# Patient Record
Sex: Female | Born: 1935 | Race: Black or African American | Hispanic: No | Smoking: Never smoker
Health system: Southern US, Community
[De-identification: ages and names within clinical notes are randomized; demographics above are authoritative.]

## PROBLEM LIST (undated history)

## (undated) DIAGNOSIS — R51 Headache: Secondary | ICD-10-CM

## (undated) DIAGNOSIS — K5900 Constipation, unspecified: Secondary | ICD-10-CM

## (undated) DIAGNOSIS — M1711 Unilateral primary osteoarthritis, right knee: Secondary | ICD-10-CM

## (undated) DIAGNOSIS — M1990 Unspecified osteoarthritis, unspecified site: Secondary | ICD-10-CM

## (undated) DIAGNOSIS — Z9289 Personal history of other medical treatment: Secondary | ICD-10-CM

## (undated) DIAGNOSIS — K219 Gastro-esophageal reflux disease without esophagitis: Secondary | ICD-10-CM

## (undated) DIAGNOSIS — R0602 Shortness of breath: Secondary | ICD-10-CM

## (undated) DIAGNOSIS — M1712 Unilateral primary osteoarthritis, left knee: Secondary | ICD-10-CM

## (undated) DIAGNOSIS — I10 Essential (primary) hypertension: Secondary | ICD-10-CM

## (undated) HISTORY — PX: COLONOSCOPY: SHX174

## (undated) HISTORY — PX: KNEE ARTHROSCOPY: SHX127

## (undated) HISTORY — PX: ROTATOR CUFF REPAIR: SHX139

---

## 2012-05-08 ENCOUNTER — Other Ambulatory Visit: Payer: Self-pay | Admitting: Gastroenterology

## 2012-05-08 DIAGNOSIS — R110 Nausea: Secondary | ICD-10-CM

## 2012-05-16 ENCOUNTER — Encounter (HOSPITAL_COMMUNITY)
Admission: RE | Admit: 2012-05-16 | Discharge: 2012-05-16 | Disposition: A | Discharge status: Home / Self Care | Payer: Medicare Other | Source: Ambulatory Visit | Attending: Gastroenterology | Admitting: Gastroenterology

## 2012-05-16 DIAGNOSIS — R110 Nausea: Secondary | ICD-10-CM | POA: Insufficient documentation

## 2012-05-16 MED ORDER — TECHNETIUM TC 99M SULFUR COLLOID
2.0000 | Freq: Once | INTRAVENOUS | Status: AC | PRN
  Administered 2012-05-16: 2 via ORAL

## 2014-07-08 ENCOUNTER — Other Ambulatory Visit: Payer: Self-pay | Admitting: Orthopedic Surgery

## 2014-08-02 ENCOUNTER — Other Ambulatory Visit (HOSPITAL_COMMUNITY): Payer: Medicare Other

## 2014-08-02 ENCOUNTER — Encounter (HOSPITAL_COMMUNITY): Payer: Self-pay | Admitting: Pharmacy Technician

## 2014-08-02 ENCOUNTER — Ambulatory Visit (HOSPITAL_COMMUNITY)
Admission: RE | Admit: 2014-08-02 | Discharge: 2014-08-02 | Disposition: A | Discharge status: Home / Self Care | Payer: Medicare HMO | Source: Ambulatory Visit | Attending: Anesthesiology | Admitting: Anesthesiology

## 2014-08-02 ENCOUNTER — Encounter (HOSPITAL_COMMUNITY)
Admission: RE | Admit: 2014-08-02 | Discharge: 2014-08-02 | Disposition: A | Discharge status: Home / Self Care | Payer: Medicare HMO | Source: Ambulatory Visit | Attending: Orthopedic Surgery | Admitting: Orthopedic Surgery

## 2014-08-02 ENCOUNTER — Encounter (HOSPITAL_COMMUNITY): Payer: Self-pay

## 2014-08-02 DIAGNOSIS — K5900 Constipation, unspecified: Secondary | ICD-10-CM | POA: Diagnosis not present

## 2014-08-02 DIAGNOSIS — R51 Headache: Secondary | ICD-10-CM | POA: Diagnosis not present

## 2014-08-02 DIAGNOSIS — R0609 Other forms of dyspnea: Secondary | ICD-10-CM | POA: Diagnosis not present

## 2014-08-02 DIAGNOSIS — K219 Gastro-esophageal reflux disease without esophagitis: Secondary | ICD-10-CM | POA: Diagnosis not present

## 2014-08-02 DIAGNOSIS — I10 Essential (primary) hypertension: Secondary | ICD-10-CM | POA: Diagnosis not present

## 2014-08-02 DIAGNOSIS — Z01818 Encounter for other preprocedural examination: Secondary | ICD-10-CM | POA: Diagnosis present

## 2014-08-02 DIAGNOSIS — Z01810 Encounter for preprocedural cardiovascular examination: Secondary | ICD-10-CM | POA: Insufficient documentation

## 2014-08-02 DIAGNOSIS — R0989 Other specified symptoms and signs involving the circulatory and respiratory systems: Secondary | ICD-10-CM | POA: Diagnosis not present

## 2014-08-02 HISTORY — DX: Shortness of breath: R06.02

## 2014-08-02 HISTORY — DX: Unspecified osteoarthritis, unspecified site: M19.90

## 2014-08-02 HISTORY — DX: Constipation, unspecified: K59.00

## 2014-08-02 HISTORY — DX: Personal history of other medical treatment: Z92.89

## 2014-08-02 HISTORY — DX: Gastro-esophageal reflux disease without esophagitis: K21.9

## 2014-08-02 HISTORY — DX: Headache: R51

## 2014-08-02 HISTORY — DX: Essential (primary) hypertension: I10

## 2014-08-02 LAB — BASIC METABOLIC PANEL
ANION GAP: 12 (ref 5–15)
BUN: 11 mg/dL (ref 6–23)
CALCIUM: 9.2 mg/dL (ref 8.4–10.5)
CO2: 27 mEq/L (ref 19–32)
Chloride: 104 mEq/L (ref 96–112)
Creatinine, Ser: 0.92 mg/dL (ref 0.50–1.10)
GFR calc non Af Amer: 57 mL/min — ABNORMAL LOW (ref >90)
GFR, EST AFRICAN AMERICAN: 66 mL/min — AB (ref >90)
Glucose, Bld: 93 mg/dL (ref 70–99)
Potassium: 3.5 mEq/L — ABNORMAL LOW (ref 3.7–5.3)
Sodium: 143 mEq/L (ref 137–147)

## 2014-08-02 LAB — ABO/RH: ABO/RH(D): A POS

## 2014-08-02 LAB — CBC
HEMATOCRIT: 33.6 % — AB (ref 36.0–46.0)
Hemoglobin: 11.5 g/dL — ABNORMAL LOW (ref 12.0–15.0)
MCH: 32.8 pg (ref 26.0–34.0)
MCHC: 34.2 g/dL (ref 30.0–36.0)
MCV: 95.7 fL (ref 78.0–100.0)
Platelets: 227 10*3/uL (ref 150–400)
RBC: 3.51 MIL/uL — ABNORMAL LOW (ref 3.87–5.11)
RDW: 14.5 % (ref 11.5–15.5)
WBC: 4.1 10*3/uL (ref 4.0–10.5)

## 2014-08-02 LAB — PROTIME-INR
INR: 1.01 (ref 0.00–1.49)
Prothrombin Time: 13.3 seconds (ref 11.6–15.2)

## 2014-08-02 LAB — SURGICAL PCR SCREEN
MRSA, PCR: NEGATIVE
Staphylococcus aureus: NEGATIVE

## 2014-08-02 LAB — APTT: aPTT: 26 seconds (ref 24–37)

## 2014-08-02 NOTE — Pre-Procedure Instructions (Signed)
Kelsey Novak  08/02/2014   Your procedure is scheduled on: Tuesday, September 29.  Report to [facility] Admitting at 8:40AM.  Call this number if you have problems the morning of surgery: [phone]   Remember:   Do not eat food or drink liquids after midnight Monday, September 28.  Take these medicines the morning of surgery with A SIP OF WATER: - Amlodipine, Omeprazole. Take if needed: Tylenol.               Stop taking Ibuprofen or Aleve.   Do not wear jewelry, make-up or nail polish.  Do not wear lotions, powders, or perfumes.   Do not shave 48 hours prior to surgery.   Do not bring valuables to the hospital.             [facility] is not responsible for any belongings or valuables.               Contacts, dentures or bridgework may not be worn into surgery.  Leave suitcase in the car. After surgery it may be brought to your room.  For patients admitted to the hospital, discharge time is determined by your treatment team.                Special Instructions: Review  [facility] - Preparing For Surgery.   Please read over the following fact sheets that you were given: Pain Booklet, Coughing and Deep Breathing, Blood Transfusion Information and Surgical Site Infection Prevention and Incentive Spirometery

## 2014-08-09 MED ORDER — CEFAZOLIN SODIUM-DEXTROSE 2-3 GM-% IV SOLR
2.0000 g | INTRAVENOUS | Status: AC
  Administered 2014-08-10: 2 g via INTRAVENOUS
  Filled 2014-08-09: qty 50

## 2014-08-10 ENCOUNTER — Inpatient Hospital Stay (HOSPITAL_COMMUNITY): Payer: Medicare HMO | Admitting: Certified Registered Nurse Anesthetist

## 2014-08-10 ENCOUNTER — Inpatient Hospital Stay (HOSPITAL_COMMUNITY): Payer: Medicare HMO

## 2014-08-10 ENCOUNTER — Inpatient Hospital Stay (HOSPITAL_COMMUNITY)
Admission: RE | Admit: 2014-08-10 | Discharge: 2014-08-14 | DRG: 470 | Disposition: A | Discharge status: Home Health | Payer: Medicare HMO | Source: Ambulatory Visit | Attending: Orthopedic Surgery | Admitting: Orthopedic Surgery

## 2014-08-10 ENCOUNTER — Encounter (HOSPITAL_COMMUNITY): Payer: Self-pay | Admitting: *Deleted

## 2014-08-10 ENCOUNTER — Encounter (HOSPITAL_COMMUNITY)
Admission: RE | Disposition: A | Discharge status: Home Health | Payer: Self-pay | Source: Ambulatory Visit | Attending: Orthopedic Surgery

## 2014-08-10 ENCOUNTER — Encounter (HOSPITAL_COMMUNITY): Payer: Medicare HMO | Admitting: Certified Registered Nurse Anesthetist

## 2014-08-10 DIAGNOSIS — M1710 Unilateral primary osteoarthritis, unspecified knee: Secondary | ICD-10-CM | POA: Diagnosis present

## 2014-08-10 DIAGNOSIS — Z79899 Other long term (current) drug therapy: Secondary | ICD-10-CM

## 2014-08-10 DIAGNOSIS — D62 Acute posthemorrhagic anemia: Secondary | ICD-10-CM | POA: Diagnosis not present

## 2014-08-10 DIAGNOSIS — I10 Essential (primary) hypertension: Secondary | ICD-10-CM | POA: Diagnosis present

## 2014-08-10 DIAGNOSIS — K219 Gastro-esophageal reflux disease without esophagitis: Secondary | ICD-10-CM | POA: Diagnosis present

## 2014-08-10 DIAGNOSIS — M179 Osteoarthritis of knee, unspecified: Principal | ICD-10-CM | POA: Diagnosis present

## 2014-08-10 DIAGNOSIS — Z7982 Long term (current) use of aspirin: Secondary | ICD-10-CM | POA: Diagnosis not present

## 2014-08-10 DIAGNOSIS — M1712 Unilateral primary osteoarthritis, left knee: Secondary | ICD-10-CM | POA: Diagnosis present

## 2014-08-10 HISTORY — DX: Unilateral primary osteoarthritis, left knee: M17.12

## 2014-08-10 HISTORY — PX: TOTAL KNEE ARTHROPLASTY: SHX125

## 2014-08-10 SURGERY — ARTHROPLASTY, KNEE, TOTAL
Anesthesia: General | Site: Knee | Laterality: Left

## 2014-08-10 MED ORDER — MENTHOL 3 MG MT LOZG: 1.0000 | LOZENGE | OROMUCOSAL | Status: DC | PRN

## 2014-08-10 MED ORDER — DEXAMETHASONE SODIUM PHOSPHATE 10 MG/ML IJ SOLN
INTRAMUSCULAR | Status: DC | PRN
  Administered 2014-08-10: 10 mg via INTRAVENOUS

## 2014-08-10 MED ORDER — KETOROLAC TROMETHAMINE 15 MG/ML IJ SOLN
INTRAMUSCULAR | Status: AC
Start: 2014-08-10 — End: 2014-08-11
  Filled 2014-08-10: qty 1

## 2014-08-10 MED ORDER — ACETAMINOPHEN 325 MG PO TABS
650.0000 mg | ORAL_TABLET | Freq: Four times a day (QID) | ORAL | Status: DC | PRN
Start: 2014-08-10 — End: 2014-08-14

## 2014-08-10 MED ORDER — HYDROCODONE-ACETAMINOPHEN 10-325 MG PO TABS: 1.0000 | ORAL_TABLET | Freq: Four times a day (QID) | ORAL | Status: DC | PRN

## 2014-08-10 MED ORDER — METOCLOPRAMIDE HCL 5 MG/ML IJ SOLN: 5.0000 mg | Freq: Three times a day (TID) | INTRAMUSCULAR | Status: DC | PRN

## 2014-08-10 MED ORDER — HYDROMORPHONE HCL 1 MG/ML IJ SOLN
0.2500 mg | INTRAMUSCULAR | Status: DC | PRN
  Administered 2014-08-10 (×5): 0.25 mg via INTRAVENOUS

## 2014-08-10 MED ORDER — METHOCARBAMOL 1000 MG/10ML IJ SOLN
500.0000 mg | Freq: Four times a day (QID) | INTRAVENOUS | Status: DC | PRN
  Filled 2014-08-10: qty 5

## 2014-08-10 MED ORDER — DOCUSATE SODIUM 100 MG PO CAPS
100.0000 mg | ORAL_CAPSULE | Freq: Two times a day (BID) | ORAL | Status: DC
  Administered 2014-08-11 – 2014-08-14 (×8): 100 mg via ORAL
  Filled 2014-08-10 (×8): qty 1

## 2014-08-10 MED ORDER — WHITE PETROLATUM GEL
Status: AC
  Administered 2014-08-10: 1
  Filled 2014-08-10: qty 5

## 2014-08-10 MED ORDER — MIDAZOLAM HCL 2 MG/2ML IJ SOLN
INTRAMUSCULAR | Status: AC
  Administered 2014-08-10: 0.5 mg
  Filled 2014-08-10: qty 2

## 2014-08-10 MED ORDER — SODIUM CHLORIDE 0.9 % IR SOLN
Status: DC | PRN
  Administered 2014-08-10: 1000 mL

## 2014-08-10 MED ORDER — LINACLOTIDE 145 MCG PO CAPS
145.0000 ug | ORAL_CAPSULE | Freq: Every day | ORAL | Status: DC
  Administered 2014-08-10 – 2014-08-14 (×5): 145 ug via ORAL
  Filled 2014-08-10 (×5): qty 1

## 2014-08-10 MED ORDER — RIVAROXABAN 10 MG PO TABS
10.0000 mg | ORAL_TABLET | Freq: Every day | ORAL | Status: DC
  Administered 2014-08-11 – 2014-08-14 (×4): 10 mg via ORAL
  Filled 2014-08-10 (×5): qty 1

## 2014-08-10 MED ORDER — INFLUENZA VAC SPLIT QUAD 0.5 ML IM SUSY
0.5000 mL | PREFILLED_SYRINGE | INTRAMUSCULAR | Status: AC
  Administered 2014-08-12: 0.5 mL via INTRAMUSCULAR
  Filled 2014-08-10: qty 0.5

## 2014-08-10 MED ORDER — ONDANSETRON HCL 4 MG/2ML IJ SOLN
INTRAMUSCULAR | Status: DC | PRN
  Administered 2014-08-10: 4 mg via INTRAVENOUS

## 2014-08-10 MED ORDER — PHENOL 1.4 % MT LIQD: 1.0000 | OROMUCOSAL | Status: DC | PRN

## 2014-08-10 MED ORDER — METHOCARBAMOL 500 MG PO TABS
500.0000 mg | ORAL_TABLET | Freq: Four times a day (QID) | ORAL | Status: DC | PRN
  Administered 2014-08-11 – 2014-08-14 (×3): 500 mg via ORAL
  Filled 2014-08-10 (×4): qty 1

## 2014-08-10 MED ORDER — ACETAMINOPHEN 650 MG RE SUPP
650.0000 mg | Freq: Four times a day (QID) | RECTAL | Status: DC | PRN
Start: 2014-08-10 — End: 2014-08-14

## 2014-08-10 MED ORDER — MORPHINE SULFATE 2 MG/ML IJ SOLN
1.0000 mg | INTRAMUSCULAR | Status: DC | PRN
  Administered 2014-08-11: 1 mg via INTRAVENOUS
  Filled 2014-08-10: qty 1

## 2014-08-10 MED ORDER — FENTANYL CITRATE 0.05 MG/ML IJ SOLN
INTRAMUSCULAR | Status: AC
  Filled 2014-08-10: qty 5

## 2014-08-10 MED ORDER — POTASSIUM CHLORIDE IN NACL 20-0.45 MEQ/L-% IV SOLN
INTRAVENOUS | Status: DC
  Administered 2014-08-11: 03:00:00 via INTRAVENOUS
  Filled 2014-08-10 (×8): qty 1000

## 2014-08-10 MED ORDER — AMLODIPINE BESYLATE 10 MG PO TABS
10.0000 mg | ORAL_TABLET | Freq: Every day | ORAL | Status: DC
  Administered 2014-08-11 – 2014-08-14 (×4): 10 mg via ORAL
  Filled 2014-08-10 (×4): qty 1

## 2014-08-10 MED ORDER — TRIAMTERENE-HCTZ 37.5-25 MG PO TABS
0.5000 | ORAL_TABLET | Freq: Every day | ORAL | Status: DC
  Administered 2014-08-10 – 2014-08-14 (×5): 0.5 via ORAL
  Filled 2014-08-10 (×5): qty 0.5

## 2014-08-10 MED ORDER — METOCLOPRAMIDE HCL 10 MG PO TABS: 5.0000 mg | ORAL_TABLET | Freq: Three times a day (TID) | ORAL | Status: DC | PRN

## 2014-08-10 MED ORDER — LIDOCAINE HCL (CARDIAC) 20 MG/ML IV SOLN
INTRAVENOUS | Status: DC | PRN
Start: 2014-08-10 — End: 2014-08-10
  Administered 2014-08-10: 50 mg via INTRAVENOUS

## 2014-08-10 MED ORDER — KETOROLAC TROMETHAMINE 15 MG/ML IJ SOLN
7.5000 mg | Freq: Four times a day (QID) | INTRAMUSCULAR | Status: AC
  Administered 2014-08-10 (×2): 7.5 mg via INTRAVENOUS

## 2014-08-10 MED ORDER — PHENYLEPHRINE HCL 10 MG/ML IJ SOLN
10.0000 mg | INTRAVENOUS | Status: DC | PRN
  Administered 2014-08-10: 5 ug via INTRAVENOUS
  Administered 2014-08-10: 10 ug/min via INTRAVENOUS

## 2014-08-10 MED ORDER — METHOCARBAMOL 1000 MG/10ML IJ SOLN
500.0000 mg | INTRAVENOUS | Status: AC
  Administered 2014-08-10: 500 mg via INTRAVENOUS
  Filled 2014-08-10: qty 5

## 2014-08-10 MED ORDER — RIVAROXABAN 10 MG PO TABS
10.0000 mg | ORAL_TABLET | Freq: Every day | ORAL | Status: DC
Start: 2014-08-10 — End: 2018-08-07

## 2014-08-10 MED ORDER — PHENYLEPHRINE HCL 10 MG/ML IJ SOLN
INTRAMUSCULAR | Status: DC | PRN
  Administered 2014-08-10 (×2): 80 ug via INTRAVENOUS

## 2014-08-10 MED ORDER — FENTANYL CITRATE 0.05 MG/ML IJ SOLN
INTRAMUSCULAR | Status: DC | PRN
  Administered 2014-08-10 (×5): 50 ug via INTRAVENOUS

## 2014-08-10 MED ORDER — ONDANSETRON HCL 4 MG PO TABS: 4.0000 mg | ORAL_TABLET | Freq: Three times a day (TID) | ORAL | Status: DC | PRN

## 2014-08-10 MED ORDER — LACTATED RINGERS IV SOLN
INTRAVENOUS | Status: DC | PRN
Start: 2014-08-10 — End: 2014-08-10
  Administered 2014-08-10: 11:00:00 via INTRAVENOUS

## 2014-08-10 MED ORDER — OXYCODONE HCL 5 MG PO TABS: 5.0000 mg | ORAL_TABLET | Freq: Once | ORAL | Status: DC | PRN

## 2014-08-10 MED ORDER — FENTANYL CITRATE 0.05 MG/ML IJ SOLN
25.0000 ug | Freq: Once | INTRAMUSCULAR | Status: AC
  Administered 2014-08-10: 25 ug via INTRAVENOUS

## 2014-08-10 MED ORDER — HYDROCODONE-ACETAMINOPHEN 10-325 MG PO TABS
1.0000 | ORAL_TABLET | ORAL | Status: DC | PRN
  Administered 2014-08-11 (×3): 1 via ORAL
  Administered 2014-08-11: 2 via ORAL
  Administered 2014-08-11: 1 via ORAL
  Administered 2014-08-12 – 2014-08-14 (×7): 2 via ORAL
  Filled 2014-08-10: qty 1
  Filled 2014-08-10 (×3): qty 2
  Filled 2014-08-10: qty 1
  Filled 2014-08-10 (×3): qty 2
  Filled 2014-08-10 (×2): qty 1
  Filled 2014-08-10 (×3): qty 2

## 2014-08-10 MED ORDER — HYDROMORPHONE HCL 1 MG/ML IJ SOLN
INTRAMUSCULAR | Status: AC
  Filled 2014-08-10: qty 1

## 2014-08-10 MED ORDER — MAGNESIUM CITRATE PO SOLN: 1.0000 | Freq: Once | ORAL | Status: AC | PRN

## 2014-08-10 MED ORDER — OXYCODONE HCL 5 MG/5ML PO SOLN: 5.0000 mg | Freq: Once | ORAL | Status: DC | PRN

## 2014-08-10 MED ORDER — FENTANYL CITRATE 0.05 MG/ML IJ SOLN
INTRAMUSCULAR | Status: AC
  Administered 2014-08-10: 25 ug via INTRAVENOUS
  Filled 2014-08-10: qty 2

## 2014-08-10 MED ORDER — PROMETHAZINE HCL 25 MG/ML IJ SOLN: 6.2500 mg | INTRAMUSCULAR | Status: DC | PRN

## 2014-08-10 MED ORDER — ASPIRIN EC 81 MG PO TBEC
81.0000 mg | DELAYED_RELEASE_TABLET | Freq: Every day | ORAL | Status: DC
  Administered 2014-08-11 – 2014-08-14 (×4): 81 mg via ORAL
  Filled 2014-08-10 (×4): qty 1

## 2014-08-10 MED ORDER — LISINOPRIL 20 MG PO TABS
20.0000 mg | ORAL_TABLET | Freq: Every day | ORAL | Status: DC
  Administered 2014-08-10 – 2014-08-14 (×5): 20 mg via ORAL
  Filled 2014-08-10 (×5): qty 1

## 2014-08-10 MED ORDER — ONDANSETRON HCL 4 MG PO TABS: 4.0000 mg | ORAL_TABLET | Freq: Four times a day (QID) | ORAL | Status: DC | PRN

## 2014-08-10 MED ORDER — ROPIVACAINE HCL 5 MG/ML IJ SOLN
INTRAMUSCULAR | Status: DC | PRN
  Administered 2014-08-10: 30 mL

## 2014-08-10 MED ORDER — EPHEDRINE SULFATE 50 MG/ML IJ SOLN: INTRAMUSCULAR | Status: DC | PRN

## 2014-08-10 MED ORDER — PROPOFOL 10 MG/ML IV BOLUS
INTRAVENOUS | Status: AC
  Filled 2014-08-10: qty 20

## 2014-08-10 MED ORDER — ONDANSETRON HCL 4 MG/2ML IJ SOLN
4.0000 mg | Freq: Four times a day (QID) | INTRAMUSCULAR | Status: DC | PRN
  Administered 2014-08-12: 4 mg via INTRAVENOUS
  Filled 2014-08-10: qty 2

## 2014-08-10 MED ORDER — PHENYLEPHRINE HCL 10 MG/ML IJ SOLN: 10.0000 mg | INTRAVENOUS | Status: DC | PRN

## 2014-08-10 MED ORDER — SENNA 8.6 MG PO TABS
1.0000 | ORAL_TABLET | Freq: Two times a day (BID) | ORAL | Status: DC
  Administered 2014-08-10 – 2014-08-14 (×8): 8.6 mg via ORAL
  Filled 2014-08-10 (×9): qty 1

## 2014-08-10 MED ORDER — GLYCOPYRROLATE 0.2 MG/ML IJ SOLN
INTRAMUSCULAR | Status: DC | PRN
  Administered 2014-08-10: 0.2 mg via INTRAVENOUS

## 2014-08-10 MED ORDER — ALUM & MAG HYDROXIDE-SIMETH 200-200-20 MG/5ML PO SUSP: 30.0000 mL | ORAL | Status: DC | PRN

## 2014-08-10 MED ORDER — PROPOFOL 10 MG/ML IV BOLUS
INTRAVENOUS | Status: DC | PRN
  Administered 2014-08-10: 40 mg via INTRAVENOUS
  Administered 2014-08-10: 110 mg via INTRAVENOUS
  Administered 2014-08-10: 50 mg via INTRAVENOUS

## 2014-08-10 MED ORDER — BACLOFEN 10 MG PO TABS
10.0000 mg | ORAL_TABLET | Freq: Three times a day (TID) | ORAL | Status: DC
Start: 2014-08-10 — End: 2018-06-27

## 2014-08-10 MED ORDER — DIPHENHYDRAMINE HCL 12.5 MG/5ML PO ELIX: 12.5000 mg | ORAL_SOLUTION | ORAL | Status: DC | PRN

## 2014-08-10 MED ORDER — CEFAZOLIN SODIUM-DEXTROSE 2-3 GM-% IV SOLR
2.0000 g | Freq: Four times a day (QID) | INTRAVENOUS | Status: AC
  Administered 2014-08-10 – 2014-08-11 (×2): 2 g via INTRAVENOUS
  Filled 2014-08-10 (×2): qty 50

## 2014-08-10 MED ORDER — LACTATED RINGERS IV SOLN
INTRAVENOUS | Status: DC
  Administered 2014-08-10: 10:00:00 via INTRAVENOUS

## 2014-08-10 MED ORDER — KETOROLAC TROMETHAMINE 15 MG/ML IJ SOLN
INTRAMUSCULAR | Status: AC
  Filled 2014-08-10: qty 1

## 2014-08-10 MED ORDER — MIDAZOLAM HCL 2 MG/2ML IJ SOLN: 0.5000 mg | Freq: Once | INTRAMUSCULAR | Status: DC

## 2014-08-10 MED ORDER — BISACODYL 10 MG RE SUPP
10.0000 mg | Freq: Every day | RECTAL | Status: DC | PRN
Start: 2014-08-10 — End: 2014-08-14

## 2014-08-10 MED ORDER — POLYETHYLENE GLYCOL 3350 17 G PO PACK
17.0000 g | PACK | Freq: Every day | ORAL | Status: DC | PRN
  Administered 2014-08-12: 17 g via ORAL

## 2014-08-10 MED ORDER — SENNA-DOCUSATE SODIUM 8.6-50 MG PO TABS: 2.0000 | ORAL_TABLET | Freq: Every day | ORAL | Status: DC

## 2014-08-10 MED ORDER — PANTOPRAZOLE SODIUM 40 MG PO TBEC
80.0000 mg | DELAYED_RELEASE_TABLET | Freq: Every day | ORAL | Status: DC
  Administered 2014-08-11 – 2014-08-14 (×4): 80 mg via ORAL
  Filled 2014-08-10 (×3): qty 2

## 2014-08-10 SURGICAL SUPPLY — 59 items
BANDAGE ELASTIC 6 VELCRO ST LF (GAUZE/BANDAGES/DRESSINGS) ×6 IMPLANT
BANDAGE ESMARK 6X9 LF (GAUZE/BANDAGES/DRESSINGS) ×1 IMPLANT
BENZOIN TINCTURE PRP APPL 2/3 (GAUZE/BANDAGES/DRESSINGS) ×3 IMPLANT
BLADE SAG 18X100X1.27 (BLADE) ×3 IMPLANT
BLADE SAW RECIP 87.9 MT (BLADE) ×3 IMPLANT
BLADE SAW SGTL 13X75X1.27 (BLADE) ×3 IMPLANT
BNDG ESMARK 6X9 LF (GAUZE/BANDAGES/DRESSINGS) ×3
BOOTCOVER CLEANROOM LRG (PROTECTIVE WEAR) ×6 IMPLANT
BOWL SMART MIX CTS (DISPOSABLE) ×3 IMPLANT
CAPT RP KNEE ×3 IMPLANT
CEMENT HV SMART SET (Cement) ×6 IMPLANT
CLOSURE STERI-STRIP 1/2X4 (GAUZE/BANDAGES/DRESSINGS) ×1
CLSR STERI-STRIP ANTIMIC 1/2X4 (GAUZE/BANDAGES/DRESSINGS) ×2 IMPLANT
COVER SURGICAL LIGHT HANDLE (MISCELLANEOUS) ×3 IMPLANT
CUFF TOURNIQUET SINGLE 34IN LL (TOURNIQUET CUFF) ×3 IMPLANT
DRAPE EXTREMITY T 121X128X90 (DRAPE) ×3 IMPLANT
DRAPE PROXIMA HALF (DRAPES) ×6 IMPLANT
DRAPE U-SHAPE 47X51 STRL (DRAPES) ×3 IMPLANT
DRSG AQUACEL AG ADV 3.5X10 (GAUZE/BANDAGES/DRESSINGS) ×3 IMPLANT
DURAPREP 26ML APPLICATOR (WOUND CARE) ×3 IMPLANT
ELECT CAUTERY BLADE 6.4 (BLADE) ×3 IMPLANT
ELECT REM PT RETURN 9FT ADLT (ELECTROSURGICAL) ×3
ELECTRODE REM PT RTRN 9FT ADLT (ELECTROSURGICAL) ×1 IMPLANT
FACESHIELD WRAPAROUND (MASK) ×3 IMPLANT
GAUZE SPONGE 4X4 12PLY STRL (GAUZE/BANDAGES/DRESSINGS) ×3 IMPLANT
GLOVE BIOGEL PI ORTHO PRO SZ8 (GLOVE) ×4
GLOVE ORTHO TXT STRL SZ7.5 (GLOVE) ×3 IMPLANT
GLOVE PI ORTHO PRO STRL SZ8 (GLOVE) ×2 IMPLANT
GLOVE SURG ORTHO 8.0 STRL STRW (GLOVE) ×3 IMPLANT
GOWN STRL REUS W/ TWL XL LVL3 (GOWN DISPOSABLE) ×1 IMPLANT
GOWN STRL REUS W/TWL 2XL LVL3 (GOWN DISPOSABLE) ×3 IMPLANT
GOWN STRL REUS W/TWL XL LVL3 (GOWN DISPOSABLE) ×2
HANDPIECE INTERPULSE COAX TIP (DISPOSABLE) ×2
HOOD PEEL AWAY FACE SHEILD DIS (HOOD) ×6 IMPLANT
IMMOBILIZER KNEE 22 (SOFTGOODS) ×3 IMPLANT
KIT BASIN OR (CUSTOM PROCEDURE TRAY) ×3 IMPLANT
KIT ROOM TURNOVER OR (KITS) ×3 IMPLANT
MANIFOLD NEPTUNE II (INSTRUMENTS) ×3 IMPLANT
NS IRRIG 1000ML POUR BTL (IV SOLUTION) ×3 IMPLANT
PACK TOTAL JOINT (CUSTOM PROCEDURE TRAY) ×3 IMPLANT
PAD ABD 8X10 STRL (GAUZE/BANDAGES/DRESSINGS) ×3 IMPLANT
PAD ARMBOARD 7.5X6 YLW CONV (MISCELLANEOUS) ×6 IMPLANT
PAD CAST 4YDX4 CTTN HI CHSV (CAST SUPPLIES) ×1 IMPLANT
PADDING CAST COTTON 4X4 STRL (CAST SUPPLIES) ×2
PADDING CAST COTTON 6X4 STRL (CAST SUPPLIES) ×3 IMPLANT
SET HNDPC FAN SPRY TIP SCT (DISPOSABLE) ×1 IMPLANT
STAPLER VISISTAT 35W (STAPLE) ×3 IMPLANT
SUCTION FRAZIER TIP 10 FR DISP (SUCTIONS) ×3 IMPLANT
SUT MNCRL AB 4-0 PS2 18 (SUTURE) IMPLANT
SUT VIC AB 0 CT1 27 (SUTURE) ×2
SUT VIC AB 0 CT1 27XBRD ANBCTR (SUTURE) ×1 IMPLANT
SUT VIC AB 2-0 CT1 27 (SUTURE) ×2
SUT VIC AB 2-0 CT1 TAPERPNT 27 (SUTURE) ×1 IMPLANT
SUT VIC AB 3-0 SH 8-18 (SUTURE) ×6 IMPLANT
SYR 30ML LL (SYRINGE) ×3 IMPLANT
TOWEL OR 17X24 6PK STRL BLUE (TOWEL DISPOSABLE) ×3 IMPLANT
TOWEL OR 17X26 10 PK STRL BLUE (TOWEL DISPOSABLE) ×3 IMPLANT
TRAY FOLEY CATH 16FRSI W/METER (SET/KITS/TRAYS/PACK) IMPLANT
WATER STERILE IRR 1000ML POUR (IV SOLUTION) ×6 IMPLANT

## 2014-08-10 NOTE — Op Note (Signed)
DATE OF SURGERY:  08/10/2014 TIME: 1:50 PM  PATIENT NAME:  Kelsey Novak   AGE: 80 y.o.    PRE-OPERATIVE DIAGNOSIS:  djd left knee  POST-OPERATIVE DIAGNOSIS:  Same  PROCEDURE:  Procedure(s): LEFT TOTAL KNEE ARTHROPLASTY   SURGEON:  Eulas Post, MD   ASSISTANT:  Janace Litten, OPA-C, present and scrubbed throughout the case, critical for assistance with exposure, retraction, instrumentation, and closure.  Second Assistant: Alfredo Martinez, PA-Student   OPERATIVE IMPLANTS: Depuy PFC Sigma, Posterior Stabilized.  Femur size 2.5, Tibia size 2.5, Patella size 38 3-peg oval button, with a 10 mm polyethylene insert.   PREOPERATIVE INDICATIONS:  Tam Savoia is a 80 y.o. year old female with end stage bone on bone degenerative arthritis of the knee who failed conservative treatment, including injections, antiinflammatories, activity modification, and assistive devices, and had significant impairment of their activities of daily living, and elected for Total Knee Arthroplasty.   The risks, benefits, and alternatives were discussed at length including but not limited to the risks of infection, bleeding, nerve injury, stiffness, blood clots, the need for revision surgery, cardiopulmonary complications, among others, and they were willing to proceed.   OPERATIVE DESCRIPTION:  The patient was brought to the operative room and placed in a supine position.  General anesthesia was administered.  IV antibiotics were given.  The lower extremity was prepped and draped in the usual sterile fashion.  Time out was performed.  The leg was elevated and exsanguinated and the tourniquet was inflated.  Anterior quadriceps tendon splitting approach was performed.  The patella was everted and osteophytes were removed.  The anterior horn of the medial and lateral meniscus was removed.   The distal femur was opened with the drill and the intramedullary distal femoral cutting jig was utilized, set at 5  degrees resecting 10 mm off the distal femur.  Care was taken to protect the collateral ligaments.  Then the extramedullary tibial cutting jig was utilized making the appropriate cut using the anterior tibial crest as a reference building in appropriate posterior slope.  Care was taken during the cut to protect the medial and collateral ligaments.  The proximal tibia was removed along with the posterior horns of the menisci.  The PCL was sacrificed.  Initially, the cut was not quite deep enough and I had to recut taking an additional 2 mm. The overall bone quality was extremely soft, and during placement of the 10 mm spacer gap, this caused an indentation on the lateral femoral condyle, reflecting fairly significant osteoporosis. Ultimately I closed that area down with cement filling in the small void.  The extensor gap was measured and was approximately 10mm.    The distal femoral sizing jig was applied, taking care to avoid notching.  Then the 4-in-1 cutting jig was applied and the anterior and posterior femur was cut, along with the chamfer cuts.  All posterior osteophytes were removed.  The flexion gap was then measured and was symmetric with the extension gap.  I completed the distal femoral preparation using the appropriate jig to prepare the box.  The patella was then measured, and cut with the saw.  This measured 24 mm before the cut and then 15 mm after the cut.  The proximal tibia sized and prepared accordingly with the reamer and the punch, and then all components were trialed with the 10mm poly insert.  The knee was found to have excellent balance and full motion.    The above named components were then cemented  into place and all excess cement was removed.  The real polyethylene implant was placed.  The knee was easily taken through a range of motion and the patella tracked well and the knee irrigated copiously and the parapatellar and subcutaneous tissue closed with vicryl, and monocryl  with steri strips for the skin.  The wounds were injected with marcaine, and dressed with sterile gauze and the tourniquet released and the patient was awakened and returned to the PACU in stable and satisfactory condition.  There were no complications.  Total tourniquet time was approximately 110 minutes I did release the tourniquet after the cement had cured, confirmed that there was no major arterial injury, and then placed the tourniquet back up for closure. .Marland Kitchen

## 2014-08-10 NOTE — Anesthesia Procedure Notes (Addendum)
Anesthesia Regional Block:  Femoral nerve block  Pre-Anesthetic Checklist: ,, timeout performed, Correct Patient, Correct Site, Correct Laterality, Correct Procedure, Correct Position, site marked, Risks and benefits discussed,  Surgical consent,  Pre-op evaluation,  At surgeon's request and post-op pain management  Laterality: Left  Prep: chloraprep       Needles:  Injection technique: Single-shot  Needle Type: Echogenic Stimulator Needle     Needle Length: 5cm 5 cm Needle Gauge: 22 and 22 G    Additional Needles:  Procedures: ultrasound guided (picture in chart) and nerve stimulator Femoral nerve block  Nerve Stimulator or Paresthesia:  Response: quadraceps contraction, 1.2 mA,   Additional Responses:   Narrative:  Start time: 08/10/2014 9:50 AM End time: 08/10/2014 10:00 AM Injection made incrementally with aspirations every 5 mL.  Performed by: Personally  Anesthesiologist: Marcene Duosobert Destanie Tibbetts, MD  Additional Notes: Functioning IV was confirmed and monitors were applied.  A 50mm 22ga Arrow echogenic stimulator needle was used. Sterile prep and drape,hand hygiene and sterile gloves were used. Ultrasound guidance: relevant anatomy identified, needle position confirmed on ultrasound but unable to stimulate nerve at current less than 1.362mA, local anesthetic spread visualized around nerve(s)., vascular puncture avoided.  Image printed for medical record. Negative aspiration and negative test dose prior to incremental administration of local anesthetic. The patient tolerated the procedure well.

## 2014-08-10 NOTE — H&P (Signed)
PREOPERATIVE H&P  Chief Complaint: djd left knee  HPI: Kelsey Novak is a 80 y.o. female who presents for preoperative history and physical with a diagnosis of djd left knee. Symptoms are rated as moderate to severe, and have been worsening.  This is significantly impairing activities of daily living.  She has elected for surgical management. She has failed injections, activity modification, anti-inflammatories, and assistive devices.   Past Medical History  Diagnosis Date  . GERD (gastroesophageal reflux disease)   . Hypertension   . Headache(784.0)   . Shortness of breath     With exertion  . Constipation   . Arthritis   . History of blood transfusion    Past Surgical History  Procedure Laterality Date  . Knee arthroscopy Left     after a fall  . Rotator cuff repair Right   . Colonoscopy     History   Social History  . Marital Status: Widowed    Spouse Name: N/A    Number of Children: N/A  . Years of Education: N/A   Social History Main Topics  . Smoking status: Never Smoker   . Smokeless tobacco: None  . Alcohol Use: No  . Drug Use: No  . Sexual Activity: None   Other Topics Concern  . None   Social History Narrative  . None   History reviewed. No pertinent family history. No Known Allergies Prior to Admission medications   Medication Sig Start Date End Date Taking? Authorizing Provider  acetaminophen (TYLENOL) 500 MG tablet Take 1,000 mg by mouth daily as needed (for knee pain).    Yes Historical Provider, MD  amLODipine (NORVASC) 10 MG tablet Take 10 mg by mouth daily.   Yes Historical Provider, MD  aspirin EC 81 MG tablet Take 81 mg by mouth daily.   Yes Historical Provider, MD  Cyanocobalamin (VITAMIN B-12) 2500 MCG SUBL Place 2 tablets under the tongue daily.    Yes Historical Provider, MD  Linaclotide Karlene Einstein(LINZESS) 145 MCG CAPS capsule Take 145 mcg by mouth daily.   Yes Historical Provider, MD  lisinopril (PRINIVIL,ZESTRIL) 20 MG tablet Take 20 mg by  mouth daily.   Yes Historical Provider, MD  Multiple Vitamins-Minerals (MULTIVITAL) tablet Take 1 tablet by mouth 2 (two) times daily.    Yes Historical Provider, MD  naproxen sodium (ALEVE) 220 MG tablet Take 440 mg by mouth daily as needed (for breakthrough pain of knee).   Yes Historical Provider, MD  omeprazole (PRILOSEC) 40 MG capsule Take 40 mg by mouth daily.   Yes Historical Provider, MD  ondansetron (ZOFRAN) 4 MG tablet Take 4 mg by mouth every 8 (eight) hours as needed for nausea or vomiting.   Yes Historical Provider, MD  Tetrahydrozoline HCl (EYE DROPS OP) Place 2 drops into both eyes daily.   Yes Historical Provider, MD  triamterene-hydrochlorothiazide (MAXZIDE-25) 37.5-25 MG per tablet Take 0.5 tablets by mouth daily.   Yes Historical Provider, MD     Positive ROS: All other systems have been reviewed and were otherwise negative with the exception of those mentioned in the HPI and as above.  Physical Exam: General: Alert, no acute distress Cardiovascular: No pedal edema Respiratory: No cyanosis, no use of accessory musculature GI: No organomegaly, abdomen is soft and non-tender Skin: No lesions in the area of chief complaint Neurologic: Sensation intact distally Psychiatric: Patient is competent for consent with normal mood and affect Lymphatic: No axillary or cervical lymphadenopathy  MUSCULOSKELETAL: AROM 0-125 deg with crepitance and varus  alignment  XR with end stage osteophyte formation and subchondral sclerosis and loss of joint space (complete).  Assessment: djd left knee  Plan: Plan for Procedure(s): LEFT TOTAL KNEE ARTHROPLASTY  The risks benefits and alternatives were discussed with the patient including but not limited to the risks of nonoperative treatment, versus surgical intervention including infection, bleeding, nerve injury,  blood clots, cardiopulmonary complications, morbidity, mortality, among others, and they were willing to proceed.    Eulas Post, MD Cell [phone]   08/10/2014 10:33 AM

## 2014-08-10 NOTE — Transfer of Care (Signed)
Immediate Anesthesia Transfer of Care Note  Patient: Kelsey MorrowElnora Novak  Procedure(s) Performed: Procedure(s): LEFT TOTAL KNEE ARTHROPLASTY (Left)  Patient Location: PACU  Anesthesia Type:General and GA combined with regional for post-op pain  Level of Consciousness: awake, alert , patient cooperative and responds to stimulation  Airway & Oxygen Therapy: Patient Spontanous Breathing and Patient connected to face mask oxygen  Post-op Assessment: Report given to PACU RN, Post -op Vital signs reviewed and stable and Patient moving all extremities X 4  Post vital signs: Reviewed and stable  Complications: No apparent anesthesia complications

## 2014-08-10 NOTE — Discharge Instructions (Signed)
Diet: As you were doing prior to hospitalization   Shower:  May shower but keep the wounds dry, use an occlusive plastic wrap, NO SOAKING IN TUB.  If the bandage gets wet, change with a clean dry gauze.  Dressing:  Leave dressing in place unless saturated.  If it is saturated, change with dry gauze.  There are sticky tapes (steri-strips) on your wounds and all the stitches are absorbable.  Leave the steri-strips in place when changing your dressings, they will peel off with time, usually 2-3 weeks.  Activity:  Increase activity slowly as tolerated, but follow the weight bearing instructions below.  No lifting or driving for 6 weeks.  Weight Bearing:   As tolerated.    To prevent constipation: you may use a stool softener such as -  Colace (over the counter) 100 mg by mouth twice a day  Drink plenty of fluids (prune juice may be helpful) and high fiber foods Miralax (over the counter) for constipation as needed.    Itching:  If you experience itching with your medications, try taking only a single pain pill, or even half a pain pill at a time.  You may take up to 10 pain pills per day, and you can also use benadryl over the counter for itching or also to help with sleep.   Precautions:  If you experience chest pain or shortness of breath - call 911 immediately for transfer to the hospital emergency department!!  If you develop a fever greater that 101 F, purulent drainage from wound, increased redness or drainage from wound, or calf pain -- Call the office at [phone]                                                Follow- Up Appointment:  Please call for an appointment to be [place] in [place] - [phone]

## 2014-08-10 NOTE — Anesthesia Preprocedure Evaluation (Addendum)
Anesthesia Evaluation  Patient identified by MRN, date of birth, ID band Patient awake    Reviewed: Allergy & Precautions, H&P , NPO status , Patient's Chart, lab work & pertinent test results  Airway Mallampati: III TM Distance: >3 FB Neck ROM: Full    Dental  (+) Lower Dentures, Upper Dentures   Pulmonary  breath sounds clear to auscultation        Cardiovascular hypertension, Pt. on medications Rhythm:Regular Rate:Normal     Neuro/Psych negative neurological ROS  negative psych ROS   GI/Hepatic Neg liver ROS, GERD-  ,  Endo/Other  negative endocrine ROS  Renal/GU negative Renal ROS  negative genitourinary   Musculoskeletal  (+) Arthritis -,   Abdominal   Peds  Hematology negative hematology ROS (+)   Anesthesia Other Findings   Reproductive/Obstetrics                          Anesthesia Physical Anesthesia Plan  ASA: II  Anesthesia Plan: General   Post-op Pain Management:    Induction: Intravenous  Airway Management Planned: Oral ETT and LMA  Additional Equipment: None  Intra-op Plan:   Post-operative Plan: Extubation in OR  Informed Consent: I have reviewed the patients History and Physical, chart, labs and discussed the procedure including the risks, benefits and alternatives for the proposed anesthesia with the patient or authorized representative who has indicated his/her understanding and acceptance.   Dental advisory given  Plan Discussed with: CRNA and Surgeon  Anesthesia Plan Comments:        Anesthesia Quick Evaluation

## 2014-08-10 NOTE — Progress Notes (Signed)
Utilization review completed.  

## 2014-08-10 NOTE — Anesthesia Postprocedure Evaluation (Signed)
  Anesthesia Post-op Note  Patient: Kelsey Novak  Procedure(s) Performed: Procedure(s): LEFT TOTAL KNEE ARTHROPLASTY (Left)  Patient Location: PACU  Anesthesia Type:GA combined with regional for post-op pain  Level of Consciousness: awake, alert  and oriented  Airway and Oxygen Therapy: Patient Spontanous Breathing  Post-op Pain: mild  Post-op Assessment: Post-op Vital signs reviewed  Post-op Vital Signs: Reviewed  Last Vitals:  Filed Vitals:   08/10/14 1530  BP: 125/54  Pulse: 65  Temp:   Resp: 12    Complications: No apparent anesthesia complications

## 2014-08-11 ENCOUNTER — Encounter (HOSPITAL_COMMUNITY): Payer: Self-pay | Admitting: General Practice

## 2014-08-11 LAB — CBC
HCT: 26.5 % — ABNORMAL LOW (ref 36.0–46.0)
Hemoglobin: 9.1 g/dL — ABNORMAL LOW (ref 12.0–15.0)
MCH: 33.3 pg (ref 26.0–34.0)
MCHC: 34.3 g/dL (ref 30.0–36.0)
MCV: 97.1 fL (ref 78.0–100.0)
PLATELETS: 159 10*3/uL (ref 150–400)
RBC: 2.73 MIL/uL — ABNORMAL LOW (ref 3.87–5.11)
RDW: 14.4 % (ref 11.5–15.5)
WBC: 6.6 10*3/uL (ref 4.0–10.5)

## 2014-08-11 LAB — BASIC METABOLIC PANEL
ANION GAP: 11 (ref 5–15)
BUN: 12 mg/dL (ref 6–23)
CALCIUM: 8.8 mg/dL (ref 8.4–10.5)
CO2: 27 mEq/L (ref 19–32)
Chloride: 102 mEq/L (ref 96–112)
Creatinine, Ser: 0.85 mg/dL (ref 0.50–1.10)
GFR calc Af Amer: 72 mL/min — ABNORMAL LOW (ref >90)
GFR, EST NON AFRICAN AMERICAN: 63 mL/min — AB (ref >90)
Glucose, Bld: 142 mg/dL — ABNORMAL HIGH (ref 70–99)
Potassium: 4 mEq/L (ref 3.7–5.3)
SODIUM: 140 meq/L (ref 137–147)

## 2014-08-11 NOTE — Progress Notes (Signed)
     Subjective:  Patient reports pain as mild.  No complaints.  Did well overnight.  Objective:   VITALS:   Filed Vitals:   08/11/14 0000 08/11/14 0209 08/11/14 0400 08/11/14 0559  BP:  127/59  111/53  Pulse:  67  60  Temp:  99 F (37.2 C)  99 F (37.2 C)  TempSrc:      Resp: 16 16 16 16   Height:      Weight:      SpO2: 100% 100% 100% 100%    Neurologically intact Sensation intact distally Dorsiflexion/Plantar flexion intact Incision: no drainage Knee immobilizer intact.  Lab Results  Component Value Date   WBC 6.6 08/11/2014   HGB 9.1* 08/11/2014   HCT 26.5* 08/11/2014   MCV 97.1 08/11/2014   PLT 159 08/11/2014   BMET    Component Value Date/Time   NA 140 08/11/2014 0535   K 4.0 08/11/2014 0535   CL 102 08/11/2014 0535   CO2 27 08/11/2014 0535   GLUCOSE 142* 08/11/2014 0535   BUN 12 08/11/2014 0535   CREATININE 0.85 08/11/2014 0535   CALCIUM 8.8 08/11/2014 0535   GFRNONAA 63* 08/11/2014 0535   GFRAA 72* 08/11/2014 0535     Assessment/Plan: 1 Day Post-Op   Principal Problem:   Osteoarthritis of left knee Active Problems:   Knee osteoarthritis   Advance diet Up with therapy Discharge home with home health ABLA, mild, observe.  Possible dc home thurs vs. fri.   Ruta Capece P 08/11/2014, 8:13 AM   Teryl LucyJoshua Tanieka Pownall, MD Cell [phone]

## 2014-08-11 NOTE — Progress Notes (Signed)
PT Cancellation Note  Patient Details Name: Kelsey Novak MRN: [id number] DOB: [birth year]   Cancelled Treatment:    Reason Eval/Treat Not Completed: Pain limiting ability to participate.  Spoke with RN who notes pt 10/10 pain at this time and requested to hold PT at this time.  Will try back tomorrow.     Anjoli Diemer, Alison MurrayMegan F 08/11/2014, 2:47 PM

## 2014-08-11 NOTE — Care Management Note (Signed)
CARE MANAGEMENT NOTE 08/11/2014  Patient:  Kelsey Novak,Kelsey Novak   Account Number:  [id number]  Date Initiated:  08/11/2014  Documentation initiated by:  Vance PeperBRADY,Neya Creegan  Subjective/Objective Assessment:   80 yr old female admitted with DJD of the left knee. Left total knee arthroplasty preformed 08/10/14.     Action/Plan:   Case manager spoke with patient and daughter Lura Ematsy concerning home health and DME needs. Choice offered. Referral called [facility], [facility]. Patient has family support at discharge.   Anticipated DC Date:  08/12/2014   Anticipated DC Plan:  HOME W HOME HEALTH SERVICES      DC Planning Services  CM consult      [facility]  DURABLE MEDICAL EQUIPMENT   Choice offered to / List presented to:  C-1 Patient   DME arranged  [facility]  3-N-1      DME agency  [employer]     HH arranged  HH-2 PT      HH agency  [facility]   Status of service:  Completed, signed off Medicare Important Message given?  NA - LOS <3 / Initial given by admissions (If response is "NO", the following Medicare IM given date fields will be blank) Date Medicare IM given:   Medicare IM given by:   Date Additional Medicare IM given:   Additional Medicare IM given by:    Discharge Disposition:  HOME W HOME HEALTH SERVICES  Per UR Regulation:  Reviewed for med. necessity/level of care/duration of stay  If discussed at Long Length of Stay Meetings, dates discussed:    Comments:

## 2014-08-11 NOTE — Evaluation (Signed)
Occupational Therapy Evaluation Patient Details Name: Kelsey Novak MRN: [id number] DOB: 1929/11/26 Today's Date: 08/11/2014    History of Present Illness pt presents with L TKA.     Clinical Impression   Pt s/p above. Pt moving well and education provided to pt and family. No further OT needs.     Follow Up Recommendations  No OT follow up;Supervision - Intermittent    Equipment Recommendations  None recommended by OT (3 in 1 delivered to room)   Recommendations for Other Services       Precautions / Restrictions Precautions Precautions: Knee Precaution Comments: educated on precautions Restrictions Weight Bearing Restrictions: Yes RLE Weight Bearing: Weight bearing as tolerated      Mobility Bed Mobility  General bed mobility comments: not assessed  Transfers Overall transfer level: Needs assistance Equipment used: Rolling walker (2 wheeled) Transfers: Sit to/from Stand Sit to Stand: Min guard;Min assist         General transfer comment: assist to rise initially from chair.    Balance Overall balance assessment: No apparent balance deficits (not formally assessed)                                          ADL Overall ADL's : Needs assistance/impaired     Grooming: Wash/dry hands;Supervision/safety;Standing           Upper Body Dressing : Set up;Sitting   Lower Body Dressing: Min guard;Sit to/from stand   Toilet Transfer: Min guard;Supervision/safety;Ambulation;RW (3 in 1 over commode)   Toileting- Clothing Manipulation and Hygiene: Supervision/safety;Sit to/from stand   Tub/ Engineer, structuralhower Transfer: Moderate assistance;Ambulation;Shower seat;Rolling walker   Functional mobility during ADLs: Min guard;Supervision/safety;Rolling walker General ADL Comments: Educated on safety tips for home (safe shoewear, use of bag on walker, rugs). Educated on dressing technique and benefit of reaching to don/doff left sock as it allows knee to bend.  Educated on 3 in 1 and uses. Educated on tub transfer techniques and options for shower chair.     Vision                     Perception     Praxis      Pertinent Vitals/Pain Pain Assessment: 0-10 Pain Score: 3  Pain Location: Left knee Pain Descriptors / Indicators: Burning Pain Intervention(s): Repositioned     Hand Dominance     Extremity/Trunk Assessment Upper Extremity Assessment Upper Extremity Assessment: Overall WFL for tasks assessed   Lower Extremity Assessment Lower Extremity Assessment: Defer to PT evaluation LLE Deficits / Details: AAROM ~10 - 80   Cervical / Trunk Assessment Cervical / Trunk Assessment: Normal   Communication Communication Communication: No difficulties   Cognition Arousal/Alertness: Awake/alert Behavior During Therapy: WFL for tasks assessed/performed Overall Cognitive Status: Within Functional Limits for tasks assessed                     General Comments       Exercises       Shoulder Instructions      Home Living Family/patient expects to be discharged to:: Private residence Living Arrangements: Alone Available Help at Discharge: Family;Available 24 hours/day Type of Home: House Home Access: Stairs to enter Entergy CorporationEntrance Stairs-Number of Steps: 2 Entrance Stairs-Rails: None Home Layout: One level     Bathroom Shower/Tub: Chief Strategy OfficerTub/shower unit   Bathroom Toilet: Standard     Home Equipment: None  Additional Comments: pt's children will be taking turns staying with her.        Prior Functioning/Environment Level of Independence: Independent             OT Diagnosis:     OT Problem List:     OT Treatment/Interventions:      OT Goals(Current goals can be found in the care plan section)   OT Frequency:     Barriers to D/C:            Co-evaluation              End of Session Equipment Utilized During Treatment: Gait belt;Rolling walker CPM Left Knee CPM Left Knee: Off  Activity  Tolerance: Patient tolerated treatment well Patient left: in chair;with call bell/phone within reach;with family/visitor present   Time: 4098-1191 OT Time Calculation (min): 27 min Charges:  OT General Charges $OT Visit: 1 Procedure OT Evaluation $Initial OT Evaluation Tier I: 1 Procedure OT Treatments $Self Care/Home Management : 8-22 mins G-CodesEarlie Raveling OTR/L [phone] 08/11/2014, 11:39 AM

## 2014-08-11 NOTE — Evaluation (Signed)
Physical Therapy Evaluation Patient Details Name: Kelsey Novak MRN: [id number] DOB: [birth year] Today's Date: 08/11/2014   History of Present Illness  pt presents with L TKA.    Clinical Impression  Pt moving great and very motivated to improve mobility.  Pt's family to provide 24hr A at home.  Will continue to follow.      Follow Up Recommendations Home health PT;Supervision/Assistance - 24 hour    Equipment Recommendations  Rolling walker with 5" wheels;3in1 (PT)    Recommendations for Other Services       Precautions / Restrictions Precautions Precautions: None Precaution Comments: pt able to demo SLR without extensor lag.   Restrictions Weight Bearing Restrictions: Yes RLE Weight Bearing: Weight bearing as tolerated      Mobility  Bed Mobility Overal bed mobility: Needs Assistance Bed Mobility: Supine to Sit     Supine to sit: Supervision     General bed mobility comments: pt needs increased time, but able to complete without A.    Transfers Overall transfer level: Needs assistance Equipment used: Rolling walker (2 wheeled) Transfers: Sit to/from Stand Sit to Stand: Min guard         General transfer comment: cues for UE use only.    Ambulation/Gait Ambulation/Gait assistance: Min guard Ambulation Distance (Feet): 140 Feet Assistive device: Rolling walker (2 wheeled) Gait Pattern/deviations: Step-through pattern;Decreased stride length     General Gait Details: pt demos good step-through technique and WBing on L LE.  pt indicates feeling dizzy during mobility, but states it is mild.    Stairs            Wheelchair Mobility    Modified Rankin (Stroke Patients Only)       Balance Overall balance assessment: No apparent balance deficits (not formally assessed)                                           Pertinent Vitals/Pain Pain Assessment: 0-10 Pain Score: 3  Pain Location: L knee Pain Descriptors / Indicators:  Aching Pain Intervention(s): Repositioned;Premedicated before session    Home Living Family/patient expects to be discharged to:: Private residence Living Arrangements: Alone Available Help at Discharge: Family;Available 24 hours/day Type of Home: House Home Access: Stairs to enter Entrance Stairs-Rails: None Entrance Stairs-Number of Steps: 2 Home Layout: One level Home Equipment: None Additional Comments: pt's children will be taking turns staying with her.      Prior Function Level of Independence: Independent               Hand Dominance        Extremity/Trunk Assessment   Upper Extremity Assessment: Defer to OT evaluation           Lower Extremity Assessment: LLE deficits/detail   LLE Deficits / Details: AAROM ~10 - 80  Cervical / Trunk Assessment: Normal  Communication   Communication: No difficulties  Cognition Arousal/Alertness: Awake/alert Behavior During Therapy: WFL for tasks assessed/performed Overall Cognitive Status: Within Functional Limits for tasks assessed                      General Comments      Exercises        Assessment/Plan    PT Assessment Patient needs continued PT services  PT Diagnosis Abnormality of gait   PT Problem List Decreased strength;Decreased range of motion;Decreased activity tolerance;Decreased balance;Decreased mobility;Decreased  knowledge of use of DME  PT Treatment Interventions DME instruction;Gait training;Stair training;Functional mobility training;Therapeutic activities;Therapeutic exercise;Balance training;Patient/family education   PT Goals (Current goals can be found in the Care Plan section) Acute Rehab PT Goals Patient Stated Goal: Back independent.   PT Goal Formulation: With patient Time For Goal Achievement: 08/18/14 Potential to Achieve Goals: Good    Frequency 7X/week   Barriers to discharge        Co-evaluation               End of Session Equipment Utilized During  Treatment: Gait belt Activity Tolerance: Patient tolerated treatment well Patient left: in chair;with call bell/phone within reach;with family/visitor present Nurse Communication: Mobility status         Time: 4098-1191 PT Time Calculation (min): 28 min   Charges:   PT Evaluation $Initial PT Evaluation Tier I: 1 Procedure PT Treatments $Gait Training: 8-22 mins   PT G CodesSunny Schlein, [place] [phone] 08/11/2014, 9:38 AM

## 2014-08-12 DIAGNOSIS — M179 Osteoarthritis of knee, unspecified: Secondary | ICD-10-CM | POA: Diagnosis not present

## 2014-08-12 LAB — CBC
HCT: 23.7 % — ABNORMAL LOW (ref 36.0–46.0)
Hemoglobin: 8.2 g/dL — ABNORMAL LOW (ref 12.0–15.0)
MCH: 33.6 pg (ref 26.0–34.0)
MCHC: 34.6 g/dL (ref 30.0–36.0)
MCV: 97.1 fL (ref 78.0–100.0)
Platelets: 129 10*3/uL — ABNORMAL LOW (ref 150–400)
RBC: 2.44 MIL/uL — ABNORMAL LOW (ref 3.87–5.11)
RDW: 14.6 % (ref 11.5–15.5)
WBC: 6.1 10*3/uL (ref 4.0–10.5)

## 2014-08-12 NOTE — Progress Notes (Signed)
Physical Therapy Treatment Patient Details Name: Maryla Morrowlnora Colledge MRN: [id number] DOB: 1930/09/10 Today's Date: 08/12/2014    History of Present Illness pt presents with L TKA.      PT Comments    Entered room as patient completing using restroom and she was agreeable to walk with therapy. Upon ambulation patient became nauseated and requested to return to room. RN made aware and meds given. Plan for stair practice tomorrow prior to DC home  Follow Up Recommendations  Home health PT;Supervision/Assistance - 24 hour     Equipment Recommendations  Rolling walker with 5" wheels;3in1 (PT)    Recommendations for Other Services       Precautions / Restrictions Precautions Precautions: Knee Precaution Comments: educated on precautions Restrictions RLE Weight Bearing: Weight bearing as tolerated    Mobility  Bed Mobility               General bed mobility comments: not assessed  Transfers Overall transfer level: Needs assistance Equipment used: Rolling walker (2 wheeled)   Sit to Stand: Min assist         General transfer comment: assist to rise. Cues for hand placement  Ambulation/Gait Ambulation/Gait assistance: Min guard Ambulation Distance (Feet): 100 Feet Assistive device: Rolling walker (2 wheeled) Gait Pattern/deviations: Step-through pattern;Decreased stride length   Gait velocity interpretation: Below normal speed for age/gender General Gait Details: pt demos good step-through technique and WBing on L LE.  Limited due to onset of nausea   Stairs            Wheelchair Mobility    Modified Rankin (Stroke Patients Only)       Balance                                    Cognition Arousal/Alertness: Awake/alert Behavior During Therapy: WFL for tasks assessed/performed Overall Cognitive Status: Within Functional Limits for tasks assessed                      Exercises      General Comments        Pertinent  Vitals/Pain Pain Score: 7  Pain Location: L knee Pain Descriptors / Indicators: Aching Pain Intervention(s): Monitored during session;Patient requesting pain meds-RN notified    Home Living                      Prior Function            PT Goals (current goals can now be found in the care plan section) Progress towards PT goals: Progressing toward goals    Frequency  7X/week    PT Plan Current plan remains appropriate    Co-evaluation             End of Session Equipment Utilized During Treatment: Gait belt Activity Tolerance: Other (comment) (nausea)       Time: 5621-30861445-1458 PT Time Calculation (min): 13 min  Charges:  $Gait Training: 8-22 mins                    G Codes:      Fredrich BirksRobinette, Julia Elizabeth 08/12/2014, 3:12 PM 08/12/2014 Fredrich Birksobinette, Julia Elizabeth PTA [phone] pager [phone] office

## 2014-08-12 NOTE — Progress Notes (Signed)
Patient ID: Kelsey Morrowlnora Novak, female   DOB: May 29, 1930, 80 y.o.   MRN: [id number]     Subjective:  Patient reports pain as mild.  Patient states that she is doing well and is ready to go home  Objective:   VITALS:   Filed Vitals:   08/11/14 2000 08/11/14 2115 08/11/14 2154 08/12/14 0540  BP:  148/98 109/51 119/50  Pulse:   71 79  Temp:  97.6 F (36.4 C) 100 F (37.8 C) 98.7 F (37.1 C)  TempSrc:  Oral    Resp: 16 21 18 16   Height:      Weight:      SpO2:  96% 96% 96%    ABD soft Sensation intact distally Dorsiflexion/Plantar flexion intact Incision: dressing C/D/I and moderate drainage Dressing removed wound clean and dry and no sign of infection  Lab Results  Component Value Date   WBC 6.1 08/12/2014   HGB 8.2* 08/12/2014   HCT 23.7* 08/12/2014   MCV 97.1 08/12/2014   PLT 129* 08/12/2014   BMET    Component Value Date/Time   NA 140 08/11/2014 0535   K 4.0 08/11/2014 0535   CL 102 08/11/2014 0535   CO2 27 08/11/2014 0535   GLUCOSE 142* 08/11/2014 0535   BUN 12 08/11/2014 0535   CREATININE 0.85 08/11/2014 0535   CALCIUM 8.8 08/11/2014 0535   GFRNONAA 63* 08/11/2014 0535   GFRAA 72* 08/11/2014 0535     Assessment/Plan: 2 Days Post-Op   Principal Problem:   Osteoarthritis of left knee Active Problems:   Knee osteoarthritis   Advance diet Up with therapy Discharge home with home health  Possibly tomorrow WBAT Dry dressing PRN ABLA will observe and recheck in am.  History of transfusion, and came in anemic preop.   Haskel KhanDOUGLAS PARRY, BRANDON 08/12/2014, 2:18 PM  Seen and agree.  Teryl LucyJoshua Yaneliz Radebaugh, MD Cell [phone]

## 2014-08-12 NOTE — Progress Notes (Signed)
     Subjective:  Patient reports pain as moderate after PT.  Denies SOB/CP.  Objective:   VITALS:   Filed Vitals:   08/11/14 2000 08/11/14 2115 08/11/14 2154 08/12/14 0540  BP:  148/98 109/51 119/50  Pulse:   71 79  Temp:  97.6 F (36.4 C) 100 F (37.8 C) 98.7 F (37.1 C)  TempSrc:  Oral    Resp: 16 21 18 16   Height:      Weight:      SpO2:  96% 96% 96%    Neurologically intact Sensation intact distally Dorsiflexion/Plantar flexion intact Incision: moderate drainage Dressing changed.  Lab Results  Component Value Date   WBC 6.1 08/12/2014   HGB 8.2* 08/12/2014   HCT 23.7* 08/12/2014   MCV 97.1 08/12/2014   PLT 129* 08/12/2014   BMET    Component Value Date/Time   NA 140 08/11/2014 0535   K 4.0 08/11/2014 0535   CL 102 08/11/2014 0535   CO2 27 08/11/2014 0535   GLUCOSE 142* 08/11/2014 0535   BUN 12 08/11/2014 0535   CREATININE 0.85 08/11/2014 0535   CALCIUM 8.8 08/11/2014 0535   GFRNONAA 63* 08/11/2014 0535   GFRAA 72* 08/11/2014 0535     Assessment/Plan: 2 Days Post-Op   Principal Problem:   Osteoarthritis of left knee Active Problems:   Knee osteoarthritis   Advance diet Up with therapy Plan for discharge tomorrow Discharge home with home health ABLA, monitor, currently asymptomatic with vital sign changes.   Zeena Starkel P 08/12/2014, 9:11 AM   Teryl LucyJoshua Taneeka Curtner, MD Cell [phone]

## 2014-08-12 NOTE — Progress Notes (Signed)
Physical Therapy Treatment Patient Details Name: Kelsey Novak MRN: [id number] DOB: 20-Dec-1929 Today's Date: 08/12/2014    History of Present Illness pt presents with L TKA.      PT Comments    Patient is feeling much better today than yesterday afternoon. She is having some mild pain this AM but able to ambulate well and work on therex. Will continue with current POC as patient is not planning on going home until tomorrow. Will practice steps later this afternoon  Follow Up Recommendations  Home health PT;Supervision/Assistance - 24 hour     Equipment Recommendations  Rolling walker with 5" wheels;3in1 (PT)    Recommendations for Other Services       Precautions / Restrictions Precautions Precautions: Knee Restrictions Weight Bearing Restrictions: Yes RLE Weight Bearing: Weight bearing as tolerated    Mobility  Bed Mobility         Supine to sit: Supervision        Transfers Overall transfer level: Needs assistance Equipment used: Rolling walker (2 wheeled)   Sit to Stand: Min assist         General transfer comment: assist to rise initially from bed. Cues for hand placement  Ambulation/Gait Ambulation/Gait assistance: Min guard Ambulation Distance (Feet): 180 Feet Assistive device: Rolling walker (2 wheeled) Gait Pattern/deviations: Step-through pattern;Decreased stride length   Gait velocity interpretation: Below normal speed for age/gender General Gait Details: pt demos good step-through technique and WBing on L LE.    Stairs            Wheelchair Mobility    Modified Rankin (Stroke Patients Only)       Balance                                    Cognition Arousal/Alertness: Awake/alert Behavior During Therapy: WFL for tasks assessed/performed Overall Cognitive Status: Within Functional Limits for tasks assessed                      Exercises Total Joint Exercises Quad Sets: AROM;Left;10 reps Heel  Slides: 10 reps;Left;AAROM Hip ABduction/ADduction: AAROM;Left;10 reps Straight Leg Raises: AAROM;Left;10 reps Long Arc Quad: AROM;Left;10 reps    General Comments        Pertinent Vitals/Pain Pain Score: 3  Pain Location: Lknee Pain Descriptors / Indicators: Aching Pain Intervention(s): Monitored during session    Home Living                      Prior Function            PT Goals (current goals can now be found in the care plan section) Progress towards PT goals: Progressing toward goals    Frequency  7X/week    PT Plan Current plan remains appropriate    Co-evaluation             End of Session Equipment Utilized During Treatment: Gait belt Activity Tolerance: Patient tolerated treatment well Patient left: in chair;with call bell/phone within reach;with family/visitor present     Time: 8295-62131004-1028 PT Time Calculation (min): 24 min  Charges:  $Gait Training: 8-22 mins $Therapeutic Exercise: 8-22 mins                    G Codes:      Fredrich BirksRobinette, Julia Elizabeth 08/12/2014, 10:33 AM  08/12/2014 Fredrich Birksobinette, Julia Elizabeth PTA [phone] pager [phone] office

## 2014-08-13 DIAGNOSIS — D62 Acute posthemorrhagic anemia: Secondary | ICD-10-CM | POA: Diagnosis present

## 2014-08-13 LAB — CBC
HCT: 20.8 % — ABNORMAL LOW (ref 36.0–46.0)
HEMOGLOBIN: 7.3 g/dL — AB (ref 12.0–15.0)
MCH: 33.3 pg (ref 26.0–34.0)
MCHC: 35.1 g/dL (ref 30.0–36.0)
MCV: 95 fL (ref 78.0–100.0)
Platelets: 124 10*3/uL — ABNORMAL LOW (ref 150–400)
RBC: 2.19 MIL/uL — AB (ref 3.87–5.11)
RDW: 14.4 % (ref 11.5–15.5)
WBC: 6.1 10*3/uL (ref 4.0–10.5)

## 2014-08-13 LAB — PREPARE RBC (CROSSMATCH)

## 2014-08-13 MED ORDER — SODIUM CHLORIDE 0.9 % IV SOLN: Freq: Once | INTRAVENOUS | Status: DC

## 2014-08-13 MED ORDER — ACETAMINOPHEN 325 MG PO TABS
650.0000 mg | ORAL_TABLET | Freq: Once | ORAL | Status: AC
  Administered 2014-08-13: 650 mg via ORAL
  Filled 2014-08-13: qty 2

## 2014-08-13 NOTE — Discharge Summary (Signed)
Physician Discharge Summary  Patient ID: Kelsey Novak MRN: [id number] DOB/AGE: [birth year] 78 y.o.  Admit date: 08/10/2014 Discharge date: 08/14/2014  Admission Diagnoses:  Osteoarthritis of left knee  Discharge Diagnoses:  Principal Problem:   Osteoarthritis of left knee Active Problems:   Knee osteoarthritis   Acute blood loss anemia   Past Medical History  Diagnosis Date  . GERD (gastroesophageal reflux disease)   . Hypertension   . Headache(784.0)   . Shortness of breath     With exertion  . Constipation   . Arthritis   . History of blood transfusion   . Osteoarthritis of left knee 08/10/2014    Surgeries: Procedure(s): LEFT TOTAL KNEE ARTHROPLASTY on 08/10/2014   Consultants (if any):    Discharged Condition: Improved  Hospital Course: Kelsey Morrowlnora Stepanian is an 80 y.o. female who was admitted 08/10/2014 with a diagnosis of Osteoarthritis of left knee and went to the operating room on 08/10/2014 and underwent the above named procedures.    She was given perioperative antibiotics:      Anti-infectives   Start     Dose/Rate Route Frequency Ordered Stop   08/10/14 1900  ceFAZolin (ANCEF) IVPB 2 g/50 mL premix     2 g 100 mL/hr over 30 Minutes Intravenous Every 6 hours 08/10/14 1618 08/11/14 0152   08/10/14 0600  ceFAZolin (ANCEF) IVPB 2 g/50 mL premix     2 g 100 mL/hr over 30 Minutes Intravenous On call to O.R. 08/09/14 1408 08/10/14 1128    .  She was given sequential compression devices, early ambulation, and xarelto for DVT prophylaxis. She came into the hospital anemic already, has had a previous history of blood transfusion, and postoperatively had a continual declining hemoglobin down to 7.3, at which point I held her discharge on 08/13/2014, in order to blood transfusion, and followup hgb was improved as indicated below.  She benefited maximally from the hospital stay and there were no complications.    Recent vital signs:  Filed Vitals:   08/14/14 0546   BP: 120/60  Pulse: 71  Temp: 98.8 F (37.1 C)  Resp: 14    Recent laboratory studies:  Lab Results  Component Value Date   HGB 9.4* 08/14/2014   HGB 7.3* 08/13/2014   HGB 8.2* 08/12/2014   Lab Results  Component Value Date   WBC 5.7 08/14/2014   PLT 124* 08/14/2014   Lab Results  Component Value Date   INR 1.01 08/02/2014   Lab Results  Component Value Date   NA 140 08/11/2014   K 4.0 08/11/2014   CL 102 08/11/2014   CO2 27 08/11/2014   BUN 12 08/11/2014   CREATININE 0.85 08/11/2014   GLUCOSE 142* 08/11/2014    Discharge Medications:     Medication List    STOP taking these medications       ALEVE 220 MG tablet  Generic drug:  naproxen sodium      TAKE these medications       acetaminophen 500 MG tablet  Commonly known as:  TYLENOL  Take 1,000 mg by mouth daily as needed (for knee pain).     amLODipine 10 MG tablet  Commonly known as:  NORVASC  Take 10 mg by mouth daily.     aspirin EC 81 MG tablet  Take 81 mg by mouth daily.     baclofen 10 MG tablet  Commonly known as:  LIORESAL  Take 1 tablet (10 mg total) by mouth 3 (three) times daily. As  needed for muscle spasm     EYE DROPS OP  Place 2 drops into both eyes daily.     HYDROcodone-acetaminophen 10-325 MG per tablet  Commonly known as:  NORCO  Take 1-2 tablets by mouth every 6 (six) hours as needed.     LINZESS 145 MCG Caps capsule  Generic drug:  Linaclotide  Take 145 mcg by mouth daily.     lisinopril 20 MG tablet  Commonly known as:  PRINIVIL,ZESTRIL  Take 20 mg by mouth daily.     MULTIVITAL tablet  Take 1 tablet by mouth 2 (two) times daily.     omeprazole 40 MG capsule  Commonly known as:  PRILOSEC  Take 40 mg by mouth daily.     ondansetron 4 MG tablet  Commonly known as:  ZOFRAN  Take 1 tablet (4 mg total) by mouth every 8 (eight) hours as needed for nausea or vomiting.     rivaroxaban 10 MG Tabs tablet  Commonly known as:  XARELTO  Take 1 tablet (10 mg total) by mouth daily.      sennosides-docusate sodium 8.6-50 MG tablet  Commonly known as:  SENOKOT-S  Take 2 tablets by mouth daily.     triamterene-hydrochlorothiazide 37.5-25 MG per tablet  Commonly known as:  MAXZIDE-25  Take 0.5 tablets by mouth daily.     Vitamin B-12 2500 MCG Subl  Place 2 tablets under the tongue daily.        Diagnostic Studies: Dg Chest 2 View  08/02/2014   CLINICAL DATA:  Preop films for left knee replacement. No chest complaints.  EXAM: CHEST  2 VIEW  COMPARISON:  None.  FINDINGS: Heart is upper limits normal in size. Mild prominence of the superior mediastinum felt to be vascular or related to substernal thyroid. Lungs are clear. No effusions. No acute bony abnormality. Changes of right shoulder replacement.  IMPRESSION: No active cardiopulmonary disease.   Electronically Signed   By: Charlett Nose M.D.   On: 08/02/2014 13:27   Dg Knee Left Port  08/10/2014   CLINICAL DATA:  80 year old female -status post left total knee replacement.  EXAM: PORTABLE LEFT KNEE - 1-2 VIEW  COMPARISON:  None  FINDINGS: Left total knee arthroplasty identified with postoperative changes.  No complicating features are present.  There is no evidence of dislocation.  IMPRESSION: Left total knee arthroplasty without complicating features.   Electronically Signed   By: Laveda Abbe M.D.   On: 08/10/2014 15:22    Disposition: 01-Home or Self Care    Follow-up Information   Follow up with Eulas Post, MD. Schedule an appointment as soon as possible for a visit in 2 weeks.   Specialty:  Orthopedic Surgery   Contact information:   [address] [place] [address] [phone]       Follow up with [facility]. (Someone from [facility] home health will contact you concerning start date and time for physical therapy.)    Specialty:  Home Health Services   Contact information:   [address] [place] [address] [phone]        Signed: Eulas Post 08/16/2014, 6:44 PM

## 2014-08-13 NOTE — Progress Notes (Signed)
Patient ID: Kelsey Novak, female   DOB: Apr 25, 1930, 80 y.o.   MRN: [id number]     Subjective:  Patient reports pain as mild.  Patient states that she is doing well.  Denies any CP or SOB  Objective:   VITALS:   Filed Vitals:   08/12/14 0540 08/12/14 1400 08/12/14 2050 08/13/14 0600  BP: 119/50 129/47 131/56 125/52  Pulse: 79 82 84 84  Temp: 98.7 F (37.1 C) 98.7 F (37.1 C) 99.6 F (37.6 C) 99.1 F (37.3 C)  TempSrc:   Oral Oral  Resp: 16 16 18 16   Height:      Weight:      SpO2: 96% 94% 100% 100%    ABD soft Sensation intact distally Dorsiflexion/Plantar flexion intact Incision: dressing C/D/I and scant drainage Good ROM of foot and ankle  Lab Results  Component Value Date   WBC 6.1 08/13/2014   HGB 7.3* 08/13/2014   HCT 20.8* 08/13/2014   MCV 95.0 08/13/2014   PLT 124* 08/13/2014   BMET    Component Value Date/Time   NA 140 08/11/2014 0535   K 4.0 08/11/2014 0535   CL 102 08/11/2014 0535   CO2 27 08/11/2014 0535   GLUCOSE 142* 08/11/2014 0535   BUN 12 08/11/2014 0535   CREATININE 0.85 08/11/2014 0535   CALCIUM 8.8 08/11/2014 0535   GFRNONAA 63* 08/11/2014 0535   GFRAA 72* 08/11/2014 0535     Assessment/Plan: 3 Days Post-Op   Principal Problem:   Osteoarthritis of left knee Active Problems:   Knee osteoarthritis   Advance diet Up with therapy ABLA  Plan for 2 units PRBC, recheck h/h in am, possible dc then. WBAT Dry dressing PRN    Haskel KhanDOUGLAS PARRY, BRANDON 08/13/2014, 7:18 AM  Seen and agree.  Teryl LucyJoshua Asahel Risden, MD Cell [phone]

## 2014-08-13 NOTE — Progress Notes (Signed)
Physical Therapy Treatment Patient Details Name: Kelsey Novak MRN: [id number] DOB: 1930-03-29 Today's Date: 08/13/2014    History of Present Illness pt presents with L TKA.      PT Comments    Patient feeling much better this afternoon and progressing with therapy. Able to practice steps this AM. Patient safe to D/C from a mobility standpoint based on progression towards goals set on PT eval.    Follow Up Recommendations  Home health PT;Supervision/Assistance - 24 hour     Equipment Recommendations  Rolling walker with 5" wheels;3in1 (PT)    Recommendations for Other Services       Precautions / Restrictions Precautions Precautions: Knee Precaution Comments: educated on precautions Restrictions RLE Weight Bearing: Weight bearing as tolerated    Mobility  Bed Mobility               General bed mobility comments: not assessed  Transfers Overall transfer level: Needs assistance Equipment used: Rolling walker (2 wheeled)   Sit to Stand: Min guard         General transfer comment: Cues for hand placement  Ambulation/Gait Ambulation/Gait assistance: Min guard Ambulation Distance (Feet): 120 Feet Assistive device: Rolling walker (2 wheeled) Gait Pattern/deviations: Step-through pattern;Decreased stride length     General Gait Details: pt demos good step-through technique and WBing on L LE.    Stairs Stairs: Yes Stairs assistance: Min guard Stair Management: Step to pattern;Backwards;Forwards;With walker;No rails Number of Stairs: 1 General stair comments: Patient praticed steps both forwards and backwards. Cues for sequency and technique  Wheelchair Mobility    Modified Rankin (Stroke Patients Only)       Balance                                    Cognition Arousal/Alertness: Awake/alert Behavior During Therapy: WFL for tasks assessed/performed Overall Cognitive Status: Within Functional Limits for tasks assessed                      Exercises Total Joint Exercises Quad Sets: AROM;Left;10 reps Heel Slides: 10 reps;Left;AAROM Hip ABduction/ADduction: AAROM;Left;10 reps Straight Leg Raises: AAROM;Left;10 reps Long Arc Quad: AROM;Left;10 reps    General Comments        Pertinent Vitals/Pain Pain Score: 7  Pain Location: L knee Pain Descriptors / Indicators: Aching Pain Intervention(s): Monitored during session    Home Living                      Prior Function            PT Goals (current goals can now be found in the care plan section) Progress towards PT goals: Progressing toward goals    Frequency  7X/week    PT Plan Current plan remains appropriate    Co-evaluation             End of Session Equipment Utilized During Treatment: Gait belt Activity Tolerance: Patient tolerated treatment well Patient left: in chair;with call bell/phone within reach;with family/visitor present     Time: 0811-0839 PT Time Calculation (min): 28 min  Charges:  $Gait Training: 8-22 mins $Therapeutic Exercise: 8-22 mins                    G Codes:      Fredrich BirksRobinette, Julia Elizabeth 08/13/2014, 9:52 AM 08/13/2014 Fredrich Birksobinette, Julia Elizabeth PTA [phone] pager [phone] office

## 2014-08-14 LAB — CBC
HEMATOCRIT: 26.6 % — AB (ref 36.0–46.0)
HEMOGLOBIN: 9.4 g/dL — AB (ref 12.0–15.0)
MCH: 33 pg (ref 26.0–34.0)
MCHC: 35.3 g/dL (ref 30.0–36.0)
MCV: 93.3 fL (ref 78.0–100.0)
Platelets: 124 10*3/uL — ABNORMAL LOW (ref 150–400)
RBC: 2.85 MIL/uL — AB (ref 3.87–5.11)
RDW: 15.9 % — ABNORMAL HIGH (ref 11.5–15.5)
WBC: 5.7 10*3/uL (ref 4.0–10.5)

## 2014-08-14 LAB — TYPE AND SCREEN
ABO/RH(D): A POS
ANTIBODY SCREEN: NEGATIVE
UNIT DIVISION: 0
Unit division: 0

## 2014-08-14 NOTE — Progress Notes (Signed)
SPORTS MEDICINE AND JOINT REPLACEMENT  Georgena Spurling, MD   Altamese Cabal, PA-C [address] [place], [place], [place]  [address]                             [phone]   PROGRESS NOTE  Subjective:  negative for Chest Pain  negative for Shortness of Breath  negative for Nausea/Vomiting   negative for Calf Pain  negative for Bowel Movement   Tolerating Diet: yes         Patient reports pain as 3 on 0-10 scale.    Objective: Vital signs in last 24 hours:   Patient Vitals for the past 24 hrs:  BP Temp Temp src Pulse Resp SpO2  08/14/14 0546 120/60 mmHg 98.8 F (37.1 C) - 71 14 97 %  08/14/14 0353 - - - - 14 100 %  08/14/14 0000 - - - - 14 100 %  08/13/14 2153 123/55 mmHg 98.8 F (37.1 C) - 76 14 100 %  08/13/14 2000 - - - - 14 100 %  08/13/14 1756 120/44 mmHg 99.3 F (37.4 C) Oral 71 16 99 %  08/13/14 1549 104/46 mmHg 99.5 F (37.5 C) Oral 78 16 97 %  08/13/14 1534 113/45 mmHg 99.5 F (37.5 C) Oral 83 16 97 %  08/13/14 1455 117/49 mmHg 99 F (37.2 C) Oral 84 16 97 %  08/13/14 1400 127/49 mmHg 99.9 F (37.7 C) - 85 18 97 %  08/13/14 1252 131/51 mmHg 98.8 F (37.1 C) Oral 80 16 100 %  08/13/14 1237 118/43 mmHg 98.9 F (37.2 C) Oral 70 16 100 %    @flow {1959:LAST@   Intake/Output from previous day:   10/02 0701 - 10/03 0700 In: 1125 [P.O.:240; I.V.:200] Out: -    Intake/Output this shift:       Intake/Output     10/02 0701 - 10/03 0700 10/03 0701 - 10/04 0700   P.O. 240    I.V. (mL/kg) 200 (3.6)    Blood 685    Total Intake(mL/kg) 1125 (20.3)    Net +1125          Urine Occurrence 6 x    Stool Occurrence 1 x       LABORATORY DATA:  Recent Labs  08/11/14 0535 08/12/14 0555 08/13/14 0420 08/14/14 0447  WBC 6.6 6.1 6.1 5.7  HGB 9.1* 8.2* 7.3* 9.4*  HCT 26.5* 23.7* 20.8* 26.6*  PLT 159 129* 124* 124*    Recent Labs  08/11/14 0535  NA 140  K 4.0  CL 102  CO2 27  BUN 12  CREATININE 0.85  GLUCOSE 142*  CALCIUM 8.8   Lab Results   Component Value Date   INR 1.01 08/02/2014    Examination:  General appearance: alert, cooperative and no distress Extremities: Homans sign is negative, no sign of DVT  Wound Exam: clean, dry, intact   Drainage:  Scant/small amount Serosanguinous exudate    Sensory Exam: Deep Peroneal normal   Assessment:    4 Days Post-Op  Procedure(s) (LRB): LEFT TOTAL KNEE ARTHROPLASTY (Left)  ADDITIONAL DIAGNOSIS:  Principal Problem:   Osteoarthritis of left knee Active Problems:   Knee osteoarthritis   Acute blood loss anemia  Acute Blood Loss Anemia   Plan: Physical Therapy as ordered Weight Bearing as Tolerated (WBAT)  DVT Prophylaxis:  Xarelto  DISCHARGE PLAN: Home  DISCHARGE NEEDS: HHPT, CPM, Walker and 3-in-1 comode seat  Barre Aydelott 08/14/2014, 8:06 AM

## 2014-08-14 NOTE — Progress Notes (Signed)
Physical Therapy Treatment Patient Details Name: Kelsey Novak MRN: [id number] DOB: Mar 09, 1930 Today's Date: 08/14/2014    History of Present Illness pt presents with L TKA.      PT Comments    Patient moving overall well with increased time this session. Eager to DC home. Patient safe to D/C from a mobility standpoint based on progression towards goals set on PT eval.    Follow Up Recommendations  Home health PT;Supervision/Assistance - 24 hour     Equipment Recommendations  Rolling walker with 5" wheels;3in1 (PT)    Recommendations for Other Services       Precautions / Restrictions Precautions Precautions: Knee Restrictions RLE Weight Bearing: Weight bearing as tolerated    Mobility  Bed Mobility Overal bed mobility: Modified Independent                Transfers       Sit to Stand: Min guard         General transfer comment: Patient with safe technique  Ambulation/Gait Ambulation/Gait assistance: Min guard Ambulation Distance (Feet): 140 Feet Assistive device: Rolling walker (2 wheeled) Gait Pattern/deviations: Step-through pattern;Decreased stride length     General Gait Details: pt demos good step-through technique and WBing on L LE.    Stairs   Stairs assistance: Min guard Stair Management: Step to pattern;Forwards;With walker Number of Stairs: 1 General stair comments:  Cues for sequency and technique  Wheelchair Mobility    Modified Rankin (Stroke Patients Only)       Balance                                    Cognition Arousal/Alertness: Awake/alert Behavior During Therapy: WFL for tasks assessed/performed Overall Cognitive Status: Within Functional Limits for tasks assessed                      Exercises      General Comments        Pertinent Vitals/Pain Pain Score: 7  Pain Location: Lk nee Pain Descriptors / Indicators: Sore Pain Intervention(s): Limited activity within patient's  tolerance;Monitored during session    Home Living                      Prior Function            PT Goals (current goals can now be found in the care plan section) Progress towards PT goals: Progressing toward goals    Frequency  7X/week    PT Plan Current plan remains appropriate    Co-evaluation             End of Session Equipment Utilized During Treatment: Gait belt Activity Tolerance: Patient tolerated treatment well Patient left: in chair;with call bell/phone within reach;with family/visitor present     Time: 0812-0836 PT Time Calculation (min): 24 min  Charges:  $Gait Training: 23-37 mins                    G Codes:      Fredrich BirksRobinette, Julia Elizabeth 08/14/2014, 10:29 AM 08/14/2014 Fredrich Birksobinette, Julia Elizabeth PTA [phone] pager [phone] office

## 2018-07-02 ENCOUNTER — Other Ambulatory Visit: Payer: Self-pay | Admitting: Orthopedic Surgery

## 2018-07-03 NOTE — Pre-Procedure Instructions (Signed)
Kelsey Novak  07/03/2018      Your procedure is scheduled on July 15, 2018.  [facility].M.  Call this number if you have problems the morning of surgery:  [phone]   Remember:  Do not eat or drink after midnight.    Take these medicines the morning of surgery with A SIP OF WATER : Amlodipine (Norvasc)  Follow your Doctor's instructions when to stop your Aspirin.  If no instructions were given, call your Doctor for instructions.   7 days prior to surgery STOP taking any Aspirin (unless otherwise instructed by your surgeon), Aleve, Naproxen, Ibuprofen, Motrin, Advil, Goody's, BC's, all herbal medications, fish oil, and all vitamins.    Do not wear jewelry, make-up or nail polish.  Do not wear lotions, powders, or perfumes, or deodorant.  Do not shave 48 hours prior to surgery.   Do not bring valuables to the hospital.  [facility] is not responsible for any belongings or valuables.  Contacts, dentures or bridgework may not be worn into surgery.  Leave your suitcase in the car.  After surgery it may be brought to your room.  For patients admitted to the hospital, discharge time will be determined by your treatment team.  Patients discharged the day of surgery will not be allowed to drive home.   Special instructions:   [facility]- Preparing For Surgery  Before surgery, you can play an important role. Because skin is not sterile, your skin needs to be as free of germs as possible. You can reduce the number of germs on your skin by washing with CHG (chlorahexidine gluconate) Soap before surgery.  CHG is an antiseptic cleaner which kills germs and bonds with the skin to continue killing germs even after washing.    Oral Hygiene is also important to reduce your risk of infection.  Remember - BRUSH YOUR TEETH THE MORNING OF SURGERY WITH YOUR REGULAR TOOTHPASTE  Please do not use if you have an allergy to CHG or antibacterial  soaps. If your skin becomes reddened/irritated stop using the CHG.  Do not shave (including legs and underarms) for at least 48 hours prior to first CHG shower. It is OK to shave your face.  Please follow these instructions carefully.   1. Shower the NIGHT BEFORE SURGERY and the MORNING OF SURGERY with CHG.   2. If you chose to wash your hair, wash your hair first as usual with your normal shampoo.  3. After you shampoo, rinse your hair and body thoroughly to remove the shampoo.  4. Use CHG as you would any other liquid soap. You can apply CHG directly to the skin and wash gently with a scrungie or a clean washcloth.   5. Apply the CHG Soap to your body ONLY FROM THE NECK DOWN.  Do not use on open wounds or open sores. Avoid contact with your eyes, ears, mouth and genitals (private parts). Wash Face and genitals (private parts)  with your normal soap.  6. Wash thoroughly, paying special attention to the area where your surgery will be performed.  7. Thoroughly rinse your body with warm water from the neck down.  8. DO NOT shower/wash with your normal soap after using and rinsing off the CHG Soap.  9. Pat yourself dry with a CLEAN TOWEL.  10. Wear CLEAN PAJAMAS to bed the night before surgery, wear comfortable clothes the morning of surgery  11. Place CLEAN SHEETS on your  bed the night of your first shower and DO NOT SLEEP WITH PETS.    Day of Surgery:  Do not apply any deodorants/lotions.  Please wear clean clothes to the hospital/surgery center.   Remember to brush your teeth WITH YOUR REGULAR TOOTHPASTE.    Please read over the following fact sheets that you were given.

## 2018-07-04 ENCOUNTER — Inpatient Hospital Stay (HOSPITAL_COMMUNITY)
Admission: RE | Admit: 2018-07-04 | Discharge: 2018-07-04 | Disposition: A | Discharge status: Home / Self Care | Payer: Medicare HMO | Source: Ambulatory Visit

## 2018-07-04 NOTE — Progress Notes (Signed)
Pt missed PAT appt, called x4, no answer. Left message requesting call back to reschedule.

## 2018-07-15 ENCOUNTER — Encounter (HOSPITAL_COMMUNITY): Admission: RE | Payer: Self-pay | Source: Ambulatory Visit

## 2018-07-15 ENCOUNTER — Inpatient Hospital Stay (HOSPITAL_COMMUNITY): Admission: RE | Admit: 2018-07-15 | Payer: Medicare HMO | Source: Ambulatory Visit | Admitting: Orthopedic Surgery

## 2018-07-15 SURGERY — ARTHROPLASTY, KNEE, TOTAL
Anesthesia: Choice | Laterality: Right

## 2018-08-12 NOTE — Progress Notes (Signed)
Stress test / ECHO 07-11-17 epic care everywhere

## 2018-08-12 NOTE — Patient Instructions (Signed)
Kelsey Novak  08/12/2018   Your procedure is scheduled on: 08-19-18   Report to [facility] Main  Entrance    Report to admitting at 10:45AM    Call this number if you have problems the morning of surgery [phone]     Remember: Do not eat food After Midnight. YOU MAY HAVE CLEAR LIQUIDS FROM MIDNIGHT UNTIL 7:15AM. NOTHING BY MOUTH AFTER 7:15AM! BRUSH YOUR TEETH MORNING OF SURGERY AND RINSE YOUR MOUTH OUT, NO CHEWING GUM CANDY OR MINTS.       CLEAR LIQUID DIET   Foods Allowed                                                                     Foods Excluded  Coffee and tea, regular and decaf                             liquids that you cannot  Plain Jell-O in any flavor                                             see through such as: Fruit ices (not with fruit pulp)                                     milk, soups, orange juice  Iced Popsicles                                    All solid food Carbonated beverages, regular and diet                                    Cranberry, grape and apple juices Sports drinks like Gatorade Lightly seasoned clear broth or consume(fat free) Sugar, honey syrup  Sample Menu Breakfast                                Lunch                                     Supper Cranberry juice                    Beef broth                            Chicken broth Jell-O                                     Grape juice  Apple juice Coffee or tea                        Jell-O                                      Popsicle                                                Coffee or tea                        Coffee or tea  _____________________________________________________________________     Take these medicines the morning of surgery with A SIP OF WATER: AMLODIPINE, TRAMADOL IF NEEDED                                You may not have any metal on your body including hair pins and              piercings   Do not wear jewelry, make-up, lotions, powders or perfumes, deodorant             Do not wear nail polish.  Do not shave  48 hours prior to surgery.                 Do not bring valuables to the hospital.  IS NOT             RESPONSIBLE   FOR VALUABLES.  Contacts, dentures or bridgework may not be worn into surgery.  Leave suitcase in the car. After surgery it may be brought to your room.                Please read over the following fact sheets you were given: _____________________________________________________________________             [facility] - Preparing for Surgery Before surgery, you can play an important role.  Because skin is not sterile, your skin needs to be as free of germs as possible.  You can reduce the number of germs on your skin by washing with CHG (chlorahexidine gluconate) soap before surgery.  CHG is an antiseptic cleaner which kills germs and bonds with the skin to continue killing germs even after washing. Please DO NOT use if you have an allergy to CHG or antibacterial soaps.  If your skin becomes reddened/irritated stop using the CHG and inform your nurse when you arrive at Short Stay. Do not shave (including legs and underarms) for at least 48 hours prior to the first CHG shower.  You may shave your face/neck. Please follow these instructions carefully:  1.  Shower with CHG Soap the night before surgery and the  morning of Surgery.  2.  If you choose to wash your hair, wash your hair first as usual with your  normal  shampoo.  3.  After you shampoo, rinse your hair and body thoroughly to remove the  shampoo.                           4.  Use CHG as you would any other liquid soap.  You  can apply chg directly  to the skin and wash                       Gently with a scrungie or clean washcloth.  5.  Apply the CHG Soap to your body ONLY FROM THE NECK DOWN.   Do not use on face/ open                           Wound or open sores. Avoid contact with  eyes, ears mouth and genitals (private parts).                       Wash face,  Genitals (private parts) with your normal soap.             6.  Wash thoroughly, paying special attention to the area where your surgery  will be performed.  7.  Thoroughly rinse your body with warm water from the neck down.  8.  DO NOT shower/wash with your normal soap after using and rinsing off  the CHG Soap.                9.  Pat yourself dry with a clean towel.            10.  Wear clean pajamas.            11.  Place clean sheets on your bed the night of your first shower and do not  sleep with pets. Day of Surgery : Do not apply any lotions/deodorants the morning of surgery.  Please wear clean clothes to the hospital/surgery center.  FAILURE TO FOLLOW THESE INSTRUCTIONS MAY RESULT IN THE CANCELLATION OF YOUR SURGERY PATIENT SIGNATURE_________________________________  NURSE SIGNATURE__________________________________  ________________________________________________________________________   Kelsey Novak  An incentive spirometer is a tool that can help keep your lungs clear and active. This tool measures how well you are filling your lungs with each breath. Taking long deep breaths may help reverse or decrease the chance of developing breathing (pulmonary) problems (especially infection) following:  A long period of time when you are unable to move or be active. BEFORE THE PROCEDURE   If the spirometer includes an indicator to show your best effort, your nurse or respiratory therapist will set it to a desired goal.  If possible, sit up straight or lean slightly forward. Try not to slouch.  Hold the incentive spirometer in an upright position. INSTRUCTIONS FOR USE  1. Sit on the edge of your bed if possible, or sit up as far as you can in bed or on a chair. 2. Hold the incentive spirometer in an upright position. 3. Breathe out normally. 4. Place the mouthpiece in your mouth and seal your  lips tightly around it. 5. Breathe in slowly and as deeply as possible, raising the piston or the ball toward the top of the column. 6. Hold your breath for 3-5 seconds or for as long as possible. Allow the piston or ball to fall to the bottom of the column. 7. Remove the mouthpiece from your mouth and breathe out normally. 8. Rest for a few seconds and repeat Steps 1 through 7 at least 10 times every 1-2 hours when you are awake. Take your time and take a few normal breaths between deep breaths. 9. The spirometer may include an indicator to show your best effort. Use the indicator as a goal to work toward  during each repetition. 10. After each set of 10 deep breaths, practice coughing to be sure your lungs are clear. If you have an incision (the cut made at the time of surgery), support your incision when coughing by placing a pillow or rolled up towels firmly against it. Once you are able to get out of bed, walk around indoors and cough well. You may stop using the incentive spirometer when instructed by your caregiver.  RISKS AND COMPLICATIONS  Take your time so you do not get dizzy or light-headed.  If you are in pain, you may need to take or ask for pain medication before doing incentive spirometry. It is harder to take a deep breath if you are having pain. AFTER USE  Rest and breathe slowly and easily.  It can be helpful to keep track of a log of your progress. Your caregiver can provide you with a simple table to help with this. If you are using the spirometer at home, follow these instructions: [place] IF:   You are having difficultly using the spirometer.  You have trouble using the spirometer as often as instructed.  Your pain medication is not giving enough relief while using the spirometer.  You develop fever of 100.5 F (38.1 C) or higher. SEEK IMMEDIATE MEDICAL CARE IF:   You cough up bloody sputum that had not been present before.  You develop fever of 102 F  (38.9 C) or greater.  You develop worsening pain at or near the incision site. MAKE SURE YOU:   Understand these instructions.  Will watch your condition.  Will get help right away if you are not doing well or get worse. Document Released: 03/11/2007 Document Revised: 01/21/2012 Document Reviewed: 05/12/2007 [facility] Patient Information 2014 [place], [place].   ________________________________________________________________________

## 2018-08-13 ENCOUNTER — Other Ambulatory Visit: Payer: Self-pay

## 2018-08-13 ENCOUNTER — Encounter (HOSPITAL_COMMUNITY): Payer: Self-pay

## 2018-08-13 ENCOUNTER — Other Ambulatory Visit: Payer: Self-pay | Admitting: Orthopedic Surgery

## 2018-08-13 ENCOUNTER — Encounter (HOSPITAL_COMMUNITY)
Admission: RE | Admit: 2018-08-13 | Discharge: 2018-08-13 | Disposition: A | Discharge status: Home / Self Care | Payer: Medicare HMO | Source: Ambulatory Visit | Attending: Orthopedic Surgery | Admitting: Orthopedic Surgery

## 2018-08-13 DIAGNOSIS — Z01818 Encounter for other preprocedural examination: Secondary | ICD-10-CM | POA: Insufficient documentation

## 2018-08-13 LAB — BASIC METABOLIC PANEL
Anion gap: 9 (ref 5–15)
BUN: 15 mg/dL (ref 8–23)
CO2: 27 mmol/L (ref 22–32)
Calcium: 9.3 mg/dL (ref 8.9–10.3)
Chloride: 111 mmol/L (ref 98–111)
Creatinine, Ser: 0.87 mg/dL (ref 0.44–1.00)
GFR, EST NON AFRICAN AMERICAN: 59 mL/min — AB (ref >60)
Glucose, Bld: 98 mg/dL (ref 70–99)
Potassium: 4 mmol/L (ref 3.5–5.1)
SODIUM: 147 mmol/L — AB (ref 135–145)

## 2018-08-13 LAB — CBC
HCT: 32.7 % — ABNORMAL LOW (ref 36.0–46.0)
Hemoglobin: 11 g/dL — ABNORMAL LOW (ref 12.0–15.0)
MCH: 32.3 pg (ref 26.0–34.0)
MCHC: 33.6 g/dL (ref 30.0–36.0)
MCV: 95.9 fL (ref 78.0–100.0)
Platelets: 237 10*3/uL (ref 150–400)
RBC: 3.41 MIL/uL — ABNORMAL LOW (ref 3.87–5.11)
RDW: 18.7 % — AB (ref 11.5–15.5)
WBC: 4.3 10*3/uL (ref 4.0–10.5)

## 2018-08-13 LAB — SURGICAL PCR SCREEN
MRSA, PCR: NEGATIVE
STAPHYLOCOCCUS AUREUS: NEGATIVE

## 2018-08-19 ENCOUNTER — Inpatient Hospital Stay (HOSPITAL_COMMUNITY)
Admission: RE | Admit: 2018-08-19 | Discharge: 2018-08-21 | DRG: 470 | Disposition: A | Discharge status: Home Health | Payer: Medicare HMO | Source: Ambulatory Visit | Attending: Orthopedic Surgery | Admitting: Orthopedic Surgery

## 2018-08-19 ENCOUNTER — Inpatient Hospital Stay (HOSPITAL_COMMUNITY): Payer: Medicare HMO | Admitting: Anesthesiology

## 2018-08-19 ENCOUNTER — Encounter (HOSPITAL_COMMUNITY): Payer: Self-pay | Admitting: *Deleted

## 2018-08-19 ENCOUNTER — Other Ambulatory Visit: Payer: Self-pay

## 2018-08-19 ENCOUNTER — Encounter (HOSPITAL_COMMUNITY)
Admission: RE | Disposition: A | Discharge status: Home Health | Payer: Self-pay | Source: Ambulatory Visit | Attending: Orthopedic Surgery

## 2018-08-19 DIAGNOSIS — I10 Essential (primary) hypertension: Secondary | ICD-10-CM | POA: Diagnosis present

## 2018-08-19 DIAGNOSIS — Z79899 Other long term (current) drug therapy: Secondary | ICD-10-CM

## 2018-08-19 DIAGNOSIS — D62 Acute posthemorrhagic anemia: Secondary | ICD-10-CM | POA: Diagnosis not present

## 2018-08-19 DIAGNOSIS — Z96652 Presence of left artificial knee joint: Secondary | ICD-10-CM | POA: Diagnosis present

## 2018-08-19 DIAGNOSIS — Z7982 Long term (current) use of aspirin: Secondary | ICD-10-CM

## 2018-08-19 DIAGNOSIS — Z96659 Presence of unspecified artificial knee joint: Secondary | ICD-10-CM

## 2018-08-19 DIAGNOSIS — M1711 Unilateral primary osteoarthritis, right knee: Secondary | ICD-10-CM | POA: Diagnosis present

## 2018-08-19 DIAGNOSIS — K219 Gastro-esophageal reflux disease without esophagitis: Secondary | ICD-10-CM | POA: Diagnosis present

## 2018-08-19 HISTORY — DX: Unilateral primary osteoarthritis, right knee: M17.11

## 2018-08-19 HISTORY — PX: TOTAL KNEE ARTHROPLASTY: SHX125

## 2018-08-19 SURGERY — ARTHROPLASTY, KNEE, TOTAL
Anesthesia: Spinal | Site: Knee | Laterality: Right

## 2018-08-19 MED ORDER — BISACODYL 10 MG RE SUPP: 10.0000 mg | Freq: Every day | RECTAL | Status: DC | PRN

## 2018-08-19 MED ORDER — SODIUM CHLORIDE 0.9 % IR SOLN
Status: DC | PRN
  Administered 2018-08-19: 3000 mL

## 2018-08-19 MED ORDER — ALUM & MAG HYDROXIDE-SIMETH 200-200-20 MG/5ML PO SUSP: 30.0000 mL | ORAL | Status: DC | PRN

## 2018-08-19 MED ORDER — LIDOCAINE HCL (CARDIAC) PF 100 MG/5ML IV SOSY
PREFILLED_SYRINGE | INTRAVENOUS | Status: AC
  Filled 2018-08-19: qty 5

## 2018-08-19 MED ORDER — POLYETHYLENE GLYCOL 3350 17 G PO PACK
17.0000 g | PACK | Freq: Every day | ORAL | Status: DC | PRN
  Administered 2018-08-21: 17 g via ORAL
  Filled 2018-08-19: qty 1

## 2018-08-19 MED ORDER — FENTANYL CITRATE (PF) 100 MCG/2ML IJ SOLN
25.0000 ug | INTRAMUSCULAR | Status: DC | PRN
  Administered 2018-08-19: 50 ug via INTRAVENOUS
  Administered 2018-08-19 (×2): 25 ug via INTRAVENOUS

## 2018-08-19 MED ORDER — ONDANSETRON HCL 4 MG/2ML IJ SOLN: 4.0000 mg | Freq: Four times a day (QID) | INTRAMUSCULAR | Status: DC | PRN

## 2018-08-19 MED ORDER — TRAMADOL HCL 50 MG PO TABS: 50.0000 mg | ORAL_TABLET | Freq: Four times a day (QID) | ORAL | 0 refills | Status: AC | PRN

## 2018-08-19 MED ORDER — HYDROMORPHONE HCL 1 MG/ML IJ SOLN
INTRAMUSCULAR | Status: AC
  Administered 2018-08-19: 0.25 mg via INTRAVENOUS
  Filled 2018-08-19: qty 1

## 2018-08-19 MED ORDER — ACETAMINOPHEN 10 MG/ML IV SOLN
INTRAVENOUS | Status: AC
  Filled 2018-08-19: qty 100

## 2018-08-19 MED ORDER — DEXAMETHASONE SODIUM PHOSPHATE 10 MG/ML IJ SOLN
10.0000 mg | Freq: Once | INTRAMUSCULAR | Status: AC
  Administered 2018-08-20: 10 mg via INTRAVENOUS
  Filled 2018-08-19: qty 1

## 2018-08-19 MED ORDER — LACTATED RINGERS IV SOLN
INTRAVENOUS | Status: DC
  Administered 2018-08-19 (×3): via INTRAVENOUS

## 2018-08-19 MED ORDER — CEFAZOLIN SODIUM-DEXTROSE 2-4 GM/100ML-% IV SOLN
2.0000 g | INTRAVENOUS | Status: AC
  Administered 2018-08-19: 2 g via INTRAVENOUS
  Filled 2018-08-19: qty 100

## 2018-08-19 MED ORDER — ASPIRIN EC 325 MG PO TBEC: 325.0000 mg | DELAYED_RELEASE_TABLET | Freq: Two times a day (BID) | ORAL | 0 refills | Status: AC

## 2018-08-19 MED ORDER — ONDANSETRON HCL 4 MG PO TABS: 4.0000 mg | ORAL_TABLET | Freq: Four times a day (QID) | ORAL | Status: DC | PRN

## 2018-08-19 MED ORDER — 0.9 % SODIUM CHLORIDE (POUR BTL) OPTIME
TOPICAL | Status: DC | PRN
  Administered 2018-08-19: 1000 mL

## 2018-08-19 MED ORDER — KETOROLAC TROMETHAMINE 15 MG/ML IJ SOLN
INTRAMUSCULAR | Status: AC
  Filled 2018-08-19: qty 1

## 2018-08-19 MED ORDER — PHENOL 1.4 % MT LIQD: 1.0000 | OROMUCOSAL | Status: DC | PRN

## 2018-08-19 MED ORDER — PROPOFOL 10 MG/ML IV BOLUS
INTRAVENOUS | Status: AC
  Filled 2018-08-19: qty 60

## 2018-08-19 MED ORDER — MEPERIDINE HCL 50 MG/ML IJ SOLN: 6.2500 mg | INTRAMUSCULAR | Status: DC | PRN

## 2018-08-19 MED ORDER — ONDANSETRON HCL 4 MG/2ML IJ SOLN
INTRAMUSCULAR | Status: AC
  Filled 2018-08-19: qty 2

## 2018-08-19 MED ORDER — ZOLPIDEM TARTRATE 5 MG PO TABS: 5.0000 mg | ORAL_TABLET | Freq: Every evening | ORAL | Status: DC | PRN

## 2018-08-19 MED ORDER — ADULT MULTIVITAMIN W/MINERALS CH
1.0000 | ORAL_TABLET | Freq: Every day | ORAL | Status: DC
  Administered 2018-08-20 – 2018-08-21 (×2): 1 via ORAL
  Filled 2018-08-19 (×2): qty 1

## 2018-08-19 MED ORDER — TRAMADOL HCL 50 MG PO TABS
50.0000 mg | ORAL_TABLET | Freq: Four times a day (QID) | ORAL | Status: DC | PRN
  Administered 2018-08-21: 50 mg via ORAL
  Filled 2018-08-19: qty 1

## 2018-08-19 MED ORDER — DEXAMETHASONE SODIUM PHOSPHATE 10 MG/ML IJ SOLN
INTRAMUSCULAR | Status: DC | PRN
  Administered 2018-08-19: 10 mg via INTRAVENOUS

## 2018-08-19 MED ORDER — VITAMIN B-12 1000 MCG PO TABS
5000.0000 ug | ORAL_TABLET | Freq: Every day | ORAL | Status: DC
  Administered 2018-08-20 – 2018-08-21 (×2): 5000 ug via ORAL
  Filled 2018-08-19 (×2): qty 5

## 2018-08-19 MED ORDER — HYDROCODONE-ACETAMINOPHEN 5-325 MG PO TABS: 1.0000 | ORAL_TABLET | Freq: Four times a day (QID) | ORAL | 0 refills | Status: AC | PRN

## 2018-08-19 MED ORDER — BUPIVACAINE IN DEXTROSE 0.75-8.25 % IT SOLN
INTRATHECAL | Status: DC | PRN
  Administered 2018-08-19: 1.8 mL via INTRATHECAL

## 2018-08-19 MED ORDER — METOCLOPRAMIDE HCL 5 MG/ML IJ SOLN: 5.0000 mg | Freq: Three times a day (TID) | INTRAMUSCULAR | Status: DC | PRN

## 2018-08-19 MED ORDER — HYDROCODONE-ACETAMINOPHEN 7.5-325 MG PO TABS
1.0000 | ORAL_TABLET | ORAL | Status: DC | PRN
  Administered 2018-08-20 – 2018-08-21 (×3): 1 via ORAL
  Filled 2018-08-19 (×3): qty 1

## 2018-08-19 MED ORDER — PHENYLEPHRINE HCL 10 MG/ML IJ SOLN
INTRAMUSCULAR | Status: AC
  Filled 2018-08-19: qty 1

## 2018-08-19 MED ORDER — LISINOPRIL 20 MG PO TABS
20.0000 mg | ORAL_TABLET | Freq: Every day | ORAL | Status: DC
  Administered 2018-08-20 – 2018-08-21 (×2): 20 mg via ORAL
  Filled 2018-08-19 (×3): qty 1

## 2018-08-19 MED ORDER — ACETAMINOPHEN 10 MG/ML IV SOLN
1000.0000 mg | Freq: Once | INTRAVENOUS | Status: AC
  Administered 2018-08-19: 1000 mg via INTRAVENOUS

## 2018-08-19 MED ORDER — METHOCARBAMOL 500 MG PO TABS
500.0000 mg | ORAL_TABLET | Freq: Four times a day (QID) | ORAL | Status: DC | PRN
  Administered 2018-08-19 – 2018-08-21 (×2): 500 mg via ORAL
  Filled 2018-08-19 (×2): qty 1

## 2018-08-19 MED ORDER — KETOROLAC TROMETHAMINE 15 MG/ML IJ SOLN
7.5000 mg | Freq: Four times a day (QID) | INTRAMUSCULAR | Status: AC
  Administered 2018-08-19 – 2018-08-20 (×4): 7.5 mg via INTRAVENOUS
  Filled 2018-08-19 (×4): qty 1

## 2018-08-19 MED ORDER — DOCUSATE SODIUM 100 MG PO CAPS
100.0000 mg | ORAL_CAPSULE | Freq: Two times a day (BID) | ORAL | Status: DC
  Administered 2018-08-19 – 2018-08-21 (×4): 100 mg via ORAL
  Filled 2018-08-19 (×4): qty 1

## 2018-08-19 MED ORDER — POTASSIUM CHLORIDE IN NACL 20-0.45 MEQ/L-% IV SOLN
INTRAVENOUS | Status: DC
  Administered 2018-08-19: 19:00:00 via INTRAVENOUS
  Filled 2018-08-19: qty 1000

## 2018-08-19 MED ORDER — FENTANYL CITRATE (PF) 100 MCG/2ML IJ SOLN
INTRAMUSCULAR | Status: AC
  Administered 2018-08-19: 50 ug
  Filled 2018-08-19: qty 2

## 2018-08-19 MED ORDER — METOCLOPRAMIDE HCL 5 MG PO TABS: 5.0000 mg | ORAL_TABLET | Freq: Three times a day (TID) | ORAL | Status: DC | PRN

## 2018-08-19 MED ORDER — DIPHENHYDRAMINE HCL 12.5 MG/5ML PO ELIX: 12.5000 mg | ORAL_SOLUTION | ORAL | Status: DC | PRN

## 2018-08-19 MED ORDER — EPHEDRINE 5 MG/ML INJ
INTRAVENOUS | Status: AC
  Filled 2018-08-19: qty 10

## 2018-08-19 MED ORDER — PROPOFOL 10 MG/ML IV BOLUS
INTRAVENOUS | Status: DC | PRN
  Administered 2018-08-19 (×2): 20 mg via INTRAVENOUS

## 2018-08-19 MED ORDER — PROPOFOL 10 MG/ML IV BOLUS
INTRAVENOUS | Status: AC
  Filled 2018-08-19: qty 20

## 2018-08-19 MED ORDER — AMLODIPINE BESYLATE 10 MG PO TABS
10.0000 mg | ORAL_TABLET | Freq: Every day | ORAL | Status: DC
  Administered 2018-08-20 – 2018-08-21 (×2): 10 mg via ORAL
  Filled 2018-08-19 (×3): qty 1

## 2018-08-19 MED ORDER — EPHEDRINE SULFATE-NACL 50-0.9 MG/10ML-% IV SOSY
PREFILLED_SYRINGE | INTRAVENOUS | Status: DC | PRN
  Administered 2018-08-19 (×2): 10 mg via INTRAVENOUS

## 2018-08-19 MED ORDER — ONDANSETRON HCL 4 MG/2ML IJ SOLN
INTRAMUSCULAR | Status: DC | PRN
  Administered 2018-08-19: 4 mg via INTRAVENOUS

## 2018-08-19 MED ORDER — SENNA-DOCUSATE SODIUM 8.6-50 MG PO TABS: 2.0000 | ORAL_TABLET | Freq: Every day | ORAL | 1 refills | Status: AC

## 2018-08-19 MED ORDER — DEXAMETHASONE SODIUM PHOSPHATE 10 MG/ML IJ SOLN
INTRAMUSCULAR | Status: AC
  Filled 2018-08-19: qty 1

## 2018-08-19 MED ORDER — TRANEXAMIC ACID 1000 MG/10ML IV SOLN
1000.0000 mg | Freq: Once | INTRAVENOUS | Status: AC
  Administered 2018-08-19: 1000 mg via INTRAVENOUS
  Filled 2018-08-19: qty 10

## 2018-08-19 MED ORDER — ROPIVACAINE HCL 7.5 MG/ML IJ SOLN
INTRAMUSCULAR | Status: DC | PRN
  Administered 2018-08-19: 20 mL via PERINEURAL

## 2018-08-19 MED ORDER — CEFAZOLIN SODIUM-DEXTROSE 2-4 GM/100ML-% IV SOLN
2.0000 g | Freq: Three times a day (TID) | INTRAVENOUS | Status: AC
  Administered 2018-08-19 – 2018-08-20 (×2): 2 g via INTRAVENOUS
  Filled 2018-08-19 (×2): qty 100

## 2018-08-19 MED ORDER — ACETAMINOPHEN 325 MG PO TABS: 325.0000 mg | ORAL_TABLET | Freq: Four times a day (QID) | ORAL | Status: DC | PRN

## 2018-08-19 MED ORDER — MAGNESIUM CITRATE PO SOLN: 1.0000 | Freq: Once | ORAL | Status: DC | PRN

## 2018-08-19 MED ORDER — ONDANSETRON HCL 4 MG PO TABS: 4.0000 mg | ORAL_TABLET | Freq: Three times a day (TID) | ORAL | 0 refills | Status: AC | PRN

## 2018-08-19 MED ORDER — ACETAMINOPHEN 500 MG PO TABS
500.0000 mg | ORAL_TABLET | Freq: Four times a day (QID) | ORAL | Status: AC
  Administered 2018-08-20 (×4): 500 mg via ORAL
  Filled 2018-08-19 (×4): qty 1

## 2018-08-19 MED ORDER — METHOCARBAMOL 500 MG IVPB - SIMPLE MED
500.0000 mg | Freq: Four times a day (QID) | INTRAVENOUS | Status: DC | PRN
  Administered 2018-08-19: 500 mg via INTRAVENOUS
  Filled 2018-08-19: qty 50

## 2018-08-19 MED ORDER — POLYVINYL ALCOHOL 1.4 % OP SOLN: 2.0000 [drp] | Freq: Every day | OPHTHALMIC | Status: DC | PRN

## 2018-08-19 MED ORDER — ASPIRIN EC 325 MG PO TBEC
325.0000 mg | DELAYED_RELEASE_TABLET | Freq: Two times a day (BID) | ORAL | Status: DC
  Administered 2018-08-19 – 2018-08-21 (×4): 325 mg via ORAL
  Filled 2018-08-19 (×4): qty 1

## 2018-08-19 MED ORDER — MENTHOL 3 MG MT LOZG: 1.0000 | LOZENGE | OROMUCOSAL | Status: DC | PRN

## 2018-08-19 MED ORDER — METOCLOPRAMIDE HCL 5 MG/ML IJ SOLN: 10.0000 mg | Freq: Once | INTRAMUSCULAR | Status: DC | PRN

## 2018-08-19 MED ORDER — MORPHINE SULFATE (PF) 2 MG/ML IV SOLN: 0.5000 mg | INTRAVENOUS | Status: DC | PRN

## 2018-08-19 MED ORDER — PROPOFOL 500 MG/50ML IV EMUL
INTRAVENOUS | Status: DC | PRN
  Administered 2018-08-19: 75 ug/kg/min via INTRAVENOUS

## 2018-08-19 MED ORDER — CHLORHEXIDINE GLUCONATE 4 % EX LIQD: 60.0000 mL | Freq: Once | CUTANEOUS | Status: DC

## 2018-08-19 MED ORDER — HYDROCODONE-ACETAMINOPHEN 5-325 MG PO TABS
1.0000 | ORAL_TABLET | ORAL | Status: DC | PRN
  Administered 2018-08-20 (×2): 1 via ORAL
  Filled 2018-08-19 (×2): qty 1

## 2018-08-19 MED ORDER — HYDROMORPHONE HCL 1 MG/ML IJ SOLN
0.2500 mg | INTRAMUSCULAR | Status: DC | PRN
  Administered 2018-08-19 (×2): 0.25 mg via INTRAVENOUS

## 2018-08-19 MED ORDER — PHENYLEPHRINE 40 MCG/ML (10ML) SYRINGE FOR IV PUSH (FOR BLOOD PRESSURE SUPPORT)
PREFILLED_SYRINGE | INTRAVENOUS | Status: AC
  Filled 2018-08-19: qty 10

## 2018-08-19 MED ORDER — PHENYLEPHRINE 40 MCG/ML (10ML) SYRINGE FOR IV PUSH (FOR BLOOD PRESSURE SUPPORT)
PREFILLED_SYRINGE | INTRAVENOUS | Status: DC | PRN
  Administered 2018-08-19 (×5): 80 ug via INTRAVENOUS

## 2018-08-19 MED ORDER — STERILE WATER FOR IRRIGATION IR SOLN
Status: DC | PRN
  Administered 2018-08-19: 2000 mL

## 2018-08-19 MED ORDER — FENTANYL CITRATE (PF) 100 MCG/2ML IJ SOLN
INTRAMUSCULAR | Status: AC
  Administered 2018-08-19: 50 ug via INTRAVENOUS
  Filled 2018-08-19: qty 4

## 2018-08-19 MED ORDER — METHOCARBAMOL 500 MG IVPB - SIMPLE MED
INTRAVENOUS | Status: AC
  Administered 2018-08-19: 500 mg via INTRAVENOUS
  Filled 2018-08-19: qty 50

## 2018-08-19 MED ORDER — LIDOCAINE HCL (CARDIAC) PF 100 MG/5ML IV SOSY
PREFILLED_SYRINGE | INTRAVENOUS | Status: DC | PRN
  Administered 2018-08-19: 40 mg via INTRAVENOUS

## 2018-08-19 MED ORDER — SODIUM CHLORIDE 0.9 % IV SOLN
INTRAVENOUS | Status: DC | PRN
  Administered 2018-08-19: 50 ug/min via INTRAVENOUS

## 2018-08-19 SURGICAL SUPPLY — 63 items
BAG ZIPLOCK 12X15 (MISCELLANEOUS) IMPLANT
BANDAGE ACE 6X5 VEL STRL LF (GAUZE/BANDAGES/DRESSINGS) ×3 IMPLANT
BEARING TIBIAL VG PS 63/67X10 (Joint) ×1 IMPLANT
BENZOIN TINCTURE PRP APPL 2/3 (GAUZE/BANDAGES/DRESSINGS) ×3 IMPLANT
BLADE SAG 18X100X1.27 (BLADE) ×6 IMPLANT
BLADE SAW SGTL 13.0X1.19X90.0M (BLADE) ×3 IMPLANT
BLADE SURG 15 STRL LF DISP TIS (BLADE) ×1 IMPLANT
BLADE SURG 15 STRL SS (BLADE) ×2
BNDG ELASTIC 6X15 VLCR STRL LF (GAUZE/BANDAGES/DRESSINGS) ×3 IMPLANT
BONE VNGD MED NAIL ×3 IMPLANT
BOWL SMART MIX CTS (DISPOSABLE) ×3 IMPLANT
CEMENT BONE R 1X40 (Cement) ×6 IMPLANT
CLOSURE WOUND 1/2 X4 (GAUZE/BANDAGES/DRESSINGS) ×1
COVER SURGICAL LIGHT HANDLE (MISCELLANEOUS) ×3 IMPLANT
CUFF TOURN SGL QUICK 34 (TOURNIQUET CUFF) ×2
CUFF TRNQT CYL 34X4X40X1 (TOURNIQUET CUFF) ×1 IMPLANT
DECANTER SPIKE VIAL GLASS SM (MISCELLANEOUS) IMPLANT
DISTAL FEMORAL PEG (Knees) ×3 IMPLANT
DRAPE U-SHAPE 47X51 STRL (DRAPES) ×3 IMPLANT
DRSG PAD ABDOMINAL 8X10 ST (GAUZE/BANDAGES/DRESSINGS) ×3 IMPLANT
DURAPREP 26ML APPLICATOR (WOUND CARE) ×6 IMPLANT
ELECT REM PT RETURN 15FT ADLT (MISCELLANEOUS) ×3 IMPLANT
EVACUATOR 1/8 PVC DRAIN (DRAIN) IMPLANT
GAUZE SPONGE 4X4 12PLY STRL (GAUZE/BANDAGES/DRESSINGS) ×3 IMPLANT
GLOVE BIOGEL PI IND STRL 7.5 (GLOVE) ×2 IMPLANT
GLOVE BIOGEL PI IND STRL 8 (GLOVE) ×1 IMPLANT
GLOVE BIOGEL PI INDICATOR 7.5 (GLOVE) ×4
GLOVE BIOGEL PI INDICATOR 8 (GLOVE) ×2
GLOVE ORTHO TXT STRL SZ7.5 (GLOVE) ×6 IMPLANT
GLOVE SURG ORTHO 8.0 STRL STRW (GLOVE) ×6 IMPLANT
GOWN STRL REUS W/TWL 2XL LVL3 (GOWN DISPOSABLE) ×6 IMPLANT
GOWN STRL REUS W/TWL LRG LVL3 (GOWN DISPOSABLE) ×3 IMPLANT
HANDPIECE INTERPULSE COAX TIP (DISPOSABLE) ×2
HOLDER FOLEY CATH W/STRAP (MISCELLANEOUS) IMPLANT
HOOD PEEL AWAY FLYTE STAYCOOL (MISCELLANEOUS) ×9 IMPLANT
IMMOBILIZER KNEE 16 (SOFTGOODS) ×3 IMPLANT
IMMOBILIZER KNEE 16 UNIV (MISCELLANEOUS) ×3 IMPLANT
IMMOBILIZER KNEE 20 (SOFTGOODS)
IMMOBILIZER KNEE 20 THIGH 36 (SOFTGOODS) IMPLANT
MANIFOLD NEPTUNE II (INSTRUMENTS) ×3 IMPLANT
MEDIUM NAILS ×6 IMPLANT
NDL SAFETY ECLIPSE 18X1.5 (NEEDLE) IMPLANT
NEEDLE HYPO 18GX1.5 SHARP (NEEDLE)
NS IRRIG 1000ML POUR BTL (IV SOLUTION) ×3 IMPLANT
PACK ICE MAXI GEL EZY WRAP (MISCELLANEOUS) ×3 IMPLANT
PACK TOTAL KNEE CUSTOM (KITS) ×3 IMPLANT
PADDING CAST COTTON 6X4 STRL (CAST SUPPLIES) ×6 IMPLANT
PEG FEMORAL DISTAL (Knees) ×1 IMPLANT
PEG PATELLA SERIES A 37MMX10MM (Orthopedic Implant) ×3 IMPLANT
PLATE INTERLOK 6700 (Plate) ×3 IMPLANT
POSITIONER SURGICAL ARM (MISCELLANEOUS) ×3 IMPLANT
SET HNDPC FAN SPRY TIP SCT (DISPOSABLE) ×1 IMPLANT
STAPLER VISISTAT 35W (STAPLE) IMPLANT
STRIP CLOSURE SKIN 1/2X4 (GAUZE/BANDAGES/DRESSINGS) ×2 IMPLANT
SUT VIC AB 2-0 CT1 27 (SUTURE) ×2
SUT VIC AB 2-0 CT1 TAPERPNT 27 (SUTURE) ×1 IMPLANT
SUT VIC AB 3-0 SH 18 (SUTURE) ×3 IMPLANT
SUT VIC AB 4-0 PS2 18 (SUTURE) ×3 IMPLANT
TIBIAL BEARING VG PS 63/67X10 (Joint) ×3 IMPLANT
TRAY FOLEY MTR SLVR 16FR STAT (SET/KITS/TRAYS/PACK) ×3 IMPLANT
VANGUARD KNEE SYSTEM PS OPEN FEMORAL- RIGHT ×3 IMPLANT
WATER STERILE IRR 1000ML POUR (IV SOLUTION) ×6 IMPLANT
WRAP KNEE MAXI GEL POST OP (GAUZE/BANDAGES/DRESSINGS) ×3 IMPLANT

## 2018-08-19 NOTE — Op Note (Signed)
DATE OF SURGERY:  08/19/2018 TIME: 4:18 PM  PATIENT NAME:  Kelsey Novak   AGE: 84 y.o.    PRE-OPERATIVE DIAGNOSIS:  Right knee primary localized osteoarthritis  POST-OPERATIVE DIAGNOSIS:  Same  PROCEDURE:  Total Knee Arthroplasty  SURGEON:  Eulas Post, MD   ASSISTANT:  Janace Litten, OPA-C, present and scrubbed throughout the case, critical for assistance with exposure, retraction, instrumentation, and closure.   OPERATIVE IMPLANTS: Biomet Vanguard Fixed Bearing Posterior Stabilized Femur size 62.5, Tibia size 67, Patella size 37 3-peg oval button, with a 10 mm polyethylene insert.   PREOPERATIVE INDICATIONS:  [name] is a 84 y.o. year old female with end stage bone on bone degenerative arthritis of the knee who failed conservative treatment, including injections, antiinflammatories, activity modification, and assistive devices, and had significant impairment of their activities of daily living, and elected for Total Knee Arthroplasty.   The risks, benefits, and alternatives were discussed at length including but not limited to the risks of infection, bleeding, nerve injury, stiffness, blood clots, the need for revision surgery, cardiopulmonary complications, among others, and they were willing to proceed.  OPERATIVE FINDINGS AND UNIQUE ASPECTS OF THE CASE:  Extremely poor bone quality.  Femur subluxated medially on the tibia chronically.  I cut the tibia first.  I did not use any spacer blocks because of the bone quality.    ESTIMATED BLOOD LOSS: 250 ml  OPERATIVE DESCRIPTION:  The patient was brought to the operative room and placed in a supine position.  Spinal Anesthesia was administered with an adductor canal block.  IV antibiotics were given.  The lower extremity was prepped and draped in the usual sterile fashion.  Time out was performed.  The leg was elevated and exsanguinated and the tourniquet was inflated.  Anterior quadriceps tendon splitting approach was  performed.  The patella was everted and osteophytes were removed.  The anterior horn of the medial and lateral meniscus was removed.   Then the extramedullary tibial cutting jig was utilized making the appropriate cut using the anterior tibial crest as a reference building in appropriate posterior slope.  Care was taken during the cut to protect the medial and collateral ligaments.  The proximal tibia was removed along with the posterior horns of the menisci.  The PCL was sacrificed.    The patella was then measured, and cut with the saw.  The thickness before the cut was 22 and after the cut was 14.  The distal femur was opened with the drill and the intramedullary distal femoral cutting jig was utilized, set at 5 degrees resecting 10 mm off the distal femur.  Care was taken to protect the collateral ligaments.  The extensor gap was measured and was approximately 10mm.    The distal femoral sizing jig was applied, taking care to avoid notching.  Then the 4-in-1 cutting jig was applied and the anterior and posterior femur was cut, along with the chamfer cuts.  All posterior osteophytes were removed.  The flexion gap was then measured and was symmetric with the extension gap.  I completed the distal femoral preparation using the appropriate jig to prepare the box.   The proximal tibia sized and prepared accordingly with the reamer and the punch, and then all components were trialed with the 10mm poly insert.  The knee was found to have excellent balance and full motion.    The above named components were then cemented into place and all excess cement was removed.  The real polyethylene  implant was placed.  After the cement had cured I released the tourniquet and confirmed excellent hemostasis with no major posterior vessel injury.    The knee was easily taken through a range of motion and the patella tracked well and the knee irrigated copiously and the parapatellar and subcutaneous tissue closed  with vicryl, and monocryl with steri strips for the skin.  The wounds were injected with marcaine, and dressed with sterile gauze and the patient was awakened and returned to the PACU in stable and satisfactory condition.  There were no complications.  Total tourniquet time was 86 minutes.

## 2018-08-19 NOTE — Progress Notes (Signed)
PHARMACY NOTE:  ANTIMICROBIAL RENAL DOSAGE ADJUSTMENT  Current antimicrobial regimen includes a mismatch between antimicrobial dosage and estimated renal function.  As per policy approved by the Pharmacy & Therapeutics and Medical Executive Committees, the antimicrobial dosage will be adjusted accordingly.  Current antimicrobial dosage:  Cefazolin 2g IV q6h x 2 doses post-operatively   Indication: surgical prophylaxis   Renal Function:  Estimated Creatinine Clearance: 37.4 mL/min (by C-G formula based on SCr of 0.87 mg/dL). []      On intermittent HD, scheduled: []      On CRRT    Antimicrobial dosage has been changed to:  Cefazolin 2g IV q8h x 2 doses    Thank you for allowing pharmacy to be a part of this patient's care.  Jamse Mead, [facility] 08/19/2018 6:42 PM    \

## 2018-08-19 NOTE — Anesthesia Preprocedure Evaluation (Signed)
Anesthesia Evaluation  Patient identified by MRN, date of birth, ID band Patient awake    Reviewed: Allergy & Precautions, NPO status , Patient's Chart, lab work & pertinent test results  Airway Mallampati: II  TM Distance: >3 FB Neck ROM: Full    Dental no notable dental hx.    Pulmonary neg pulmonary ROS,    Pulmonary exam normal breath sounds clear to auscultation       Cardiovascular hypertension, Pt. on medications Normal cardiovascular exam Rhythm:Regular Rate:Normal     Neuro/Psych negative neurological ROS  negative psych ROS   GI/Hepatic Neg liver ROS, GERD  ,  Endo/Other  negative endocrine ROS  Renal/GU negative Renal ROS  negative genitourinary   Musculoskeletal negative musculoskeletal ROS (+)   Abdominal   Peds negative pediatric ROS (+)  Hematology negative hematology ROS (+)   Anesthesia Other Findings   Reproductive/Obstetrics negative OB ROS                             Anesthesia Physical Anesthesia Plan  ASA: II  Anesthesia Plan: Spinal   Post-op Pain Management:    Induction:   PONV Risk Score and Plan: 2 and Ondansetron and Treatment may vary due to age or medical condition  Airway Management Planned: Simple Face Mask  Additional Equipment:   Intra-op Plan:   Post-operative Plan:   Informed Consent: I have reviewed the patients History and Physical, chart, labs and discussed the procedure including the risks, benefits and alternatives for the proposed anesthesia with the patient or authorized representative who has indicated his/her understanding and acceptance.   Dental advisory given  Plan Discussed with: CRNA  Anesthesia Plan Comments:         Anesthesia Quick Evaluation

## 2018-08-19 NOTE — Anesthesia Procedure Notes (Signed)
Anesthesia Regional Block: Adductor canal block   Pre-Anesthetic Checklist: ,, timeout performed, Correct Patient, Correct Site, Correct Laterality, Correct Procedure, Correct Position, site marked, Risks and benefits discussed,  Surgical consent,  Pre-op evaluation,  At surgeon's request and post-op pain management  Laterality: Right and Lower  Prep: Maximum Sterile Barrier Precautions used, chloraprep       Needles:  Injection technique: Single-shot  Needle Type: Echogenic Stimulator Needle     Needle Length: 10cm      Additional Needles:   Procedures:,,,, ultrasound used (permanent image in chart),,,,  Narrative:  Start time: 08/19/2018 1:24 PM End time: 08/19/2018 1:34 PM Injection made incrementally with aspirations every 5 mL.  Performed by: Personally  Anesthesiologist: Phillips Grout, MD  Additional Notes: Risks, benefits and alternative to block explained extensively.  Patient tolerated procedure well, without complications.

## 2018-08-19 NOTE — Anesthesia Postprocedure Evaluation (Signed)
Anesthesia Post Note  Patient: Therapist, art  Procedure(s) Performed: RIGHT TOTAL KNEE ARTHROPLASTY (Right Knee)     Patient location during evaluation: PACU Anesthesia Type: Spinal Level of consciousness: awake and alert Pain management: pain level controlled Vital Signs Assessment: post-procedure vital signs reviewed and stable Respiratory status: spontaneous breathing, nonlabored ventilation and respiratory function stable Cardiovascular status: blood pressure returned to baseline and stable Postop Assessment: no apparent nausea or vomiting Anesthetic complications: no    Last Vitals:  Vitals:   08/19/18 1800 08/19/18 1824  BP: (!) 123/56 128/62  Pulse: 70 64  Resp: 17 15  Temp: 36.4 C (!) 36.4 C  SpO2: 100% 100%    Last Pain:  Vitals:   08/19/18 1824  TempSrc: Axillary  PainSc:                  Lucretia Kern

## 2018-08-19 NOTE — H&P (Signed)
PREOPERATIVE H&P  Chief Complaint: right knee osteoarthritis  HPI: Kelsey Novak is a 84 y.o. female who presents for preoperative history and physical with a diagnosis of right knee OA. Symptoms are rated as moderate to severe, and have been worsening.  This is significantly impairing activities of daily living.  She has elected for surgical management.   She has failed injections, activity modification, anti-inflammatories, and assistive devices.  Preoperative X-rays demonstrate end stage degenerative changes with osteophyte formation, loss of joint space, subchondral sclerosis.  Past Medical History:  Diagnosis Date  . Arthritis   . Constipation   . GERD (gastroesophageal reflux disease)   . Headache(784.0)   . History of blood transfusion   . Hypertension   . Osteoarthritis of left knee 08/10/2014  . Shortness of breath    With exertion   Past Surgical History:  Procedure Laterality Date  . COLONOSCOPY    . KNEE ARTHROSCOPY Left    after a fall  . ROTATOR CUFF REPAIR Right   . TOTAL KNEE ARTHROPLASTY Left 08/10/2014   dr Dion Saucier  . TOTAL KNEE ARTHROPLASTY Left 08/10/2014   Procedure: LEFT TOTAL KNEE ARTHROPLASTY;  Surgeon: Eulas Post, MD;  Location: [facility] OR;  Service: Orthopedics;  Laterality: Left;   Social History   Socioeconomic History  . Marital status: Widowed    Spouse name: Not on file  . Number of children: Not on file  . Years of education: Not on file  . Highest education level: Not on file  Occupational History  . Not on file  Social Needs  . Financial resource strain: Not on file  . Food insecurity:    Worry: Not on file    Inability: Not on file  . Transportation needs:    Medical: Not on file    Non-medical: Not on file  Tobacco Use  . Smoking status: Never Smoker  . Smokeless tobacco: Never Used  Substance and Sexual Activity  . Alcohol use: No  . Drug use: No  . Sexual activity: Not on file  Lifestyle  . Physical activity:    Days  per week: Not on file    Minutes per session: Not on file  . Stress: Not on file  Relationships  . Social connections:    Talks on phone: Not on file    Gets together: Not on file    Attends religious service: Not on file    Active member of club or organization: Not on file    Attends meetings of clubs or organizations: Not on file    Relationship status: Not on file  Other Topics Concern  . Not on file  Social History Narrative  . Not on file   No family history on file. No Known Allergies Prior to Admission medications   Medication Sig Start Date End Date Taking? Authorizing Provider  amLODipine (NORVASC) 10 MG tablet Take 10 mg by mouth daily.   Yes [provider]  aspirin EC 81 MG tablet Take 81 mg by mouth daily.   Yes [provider]  Carboxymethylcellul-Glycerin (REFRESH OPTIVE OP) Place 1 drop into both eyes daily as needed (for dry eyes).   Yes [provider]  Cyanocobalamin (B-12) 5000 MCG CAPS Take 5,000 mcg by mouth daily.   Yes [provider]  lisinopril (PRINIVIL,ZESTRIL) 20 MG tablet Take 20 mg by mouth daily.   Yes [provider]  Multiple Vitamin (MULTIVITAMIN WITH MINERALS) TABS tablet Take 2 tablets by mouth daily.  Yes [provider]  traMADol (ULTRAM) 50 MG tablet Take 50 mg by mouth every 6 (six) hours as needed for moderate pain.   Yes [provider]  ondansetron (ZOFRAN) 4 MG tablet Take 1 tablet (4 mg total) by mouth every 8 (eight) hours as needed for nausea or vomiting. Patient not taking: Reported on 08/07/2018 08/10/14   Teryl Lucy, MD     Positive ROS: All other systems have been reviewed and were otherwise negative with the exception of those mentioned in the HPI and as above.  Physical Exam: General: Alert, no acute distress Cardiovascular: No pedal edema Respiratory: No cyanosis, no use of accessory musculature GI: No organomegaly, abdomen is soft and non-tender Skin: No  lesions in the area of chief complaint Neurologic: Sensation intact distally Psychiatric: Patient is competent for consent with normal mood and affect Lymphatic: No axillary or cervical lymphadenopathy  MUSCULOSKELETAL: right knee with effusion and painful arc 5-90 with crepitance  Assessment: Right knee osteoarthritis   Plan: Plan for Procedure(s): RIGHT TOTAL KNEE ARTHROPLASTY  The risks benefits and alternatives were discussed with the patient including but not limited to the risks of nonoperative treatment, versus surgical intervention including infection, bleeding, nerve injury,  blood clots, cardiopulmonary complications, morbidity, mortality, among others, and they were willing to proceed.   Anticipated LOS equal to or greater than 2 midnights due to - Age 82 and older with one or more of the following:  - Obesity  - Expected need for hospital services (PT, OT, Nursing) required for safe  discharge  - Anticipated need for postoperative skilled nursing care or inpatient rehab  - Active co-morbidities: Anemia    Preoperative templating of the joint replacement has been completed, documented, and submitted to the Operating Room personnel in order to optimize intra-operative equipment management.  Eulas Post, MD Cell [phone]   08/19/2018 9:59 AM

## 2018-08-19 NOTE — Anesthesia Procedure Notes (Signed)
Spinal  End time: 08/19/2018 2:30 PM Staffing Resident/CRNA: Caryl Pina T, CRNA Performed: resident/CRNA  Preanesthetic Checklist Completed: patient identified, site marked, surgical consent, pre-op evaluation, timeout performed, IV checked, risks and benefits discussed and monitors and equipment checked Spinal Block Patient position: sitting Prep: DuraPrep Patient monitoring: heart rate, cardiac monitor, continuous pulse ox and blood pressure Approach: midline Location: L4-5 Injection technique: single-shot Needle Needle type: Pencan  Needle gauge: 24 G Needle length: 9 cm Assessment Sensory level: T6 Additional Notes Expiration date of kit checked and confirmed. Patient tolerated procedure well, without complications.

## 2018-08-19 NOTE — Progress Notes (Signed)
AssistedDr. Carignan with right, ultrasound guided, adductor canal block. Side rails up, monitors on throughout procedure. See vital signs in flow sheet. Tolerated Procedure well.  

## 2018-08-19 NOTE — Discharge Instructions (Signed)

## 2018-08-19 NOTE — Transfer of Care (Signed)
Immediate Anesthesia Transfer of Care Note  Patient: Kelsey Novak  Procedure(s) Performed: RIGHT TOTAL KNEE ARTHROPLASTY (Right Knee)  Patient Location: PACU  Anesthesia Type:Spinal and MAC combined with regional for post-op pain  Level of Consciousness: awake, alert  and patient cooperative  Airway & Oxygen Therapy: Patient Spontanous Breathing and Patient connected to face mask oxygen  Post-op Assessment: Report given to RN and Post -op Vital signs reviewed and stable  Post vital signs: Reviewed and stable  Last Vitals:  Vitals Value Taken Time  BP    Temp    Pulse    Resp    SpO2      Last Pain:  Vitals:   08/19/18 1054  TempSrc: Oral  PainSc: 3       Patients Stated Pain Goal: 4 (58/85/02 7741)  Complications: No apparent anesthesia complications

## 2018-08-20 LAB — BASIC METABOLIC PANEL
Anion gap: 8 (ref 5–15)
BUN: 9 mg/dL (ref 8–23)
CO2: 25 mmol/L (ref 22–32)
Calcium: 8.7 mg/dL — ABNORMAL LOW (ref 8.9–10.3)
Chloride: 108 mmol/L (ref 98–111)
Creatinine, Ser: 0.77 mg/dL (ref 0.44–1.00)
GFR calc Af Amer: >60 mL/min (ref >60)
GFR calc non Af Amer: >60 mL/min (ref >60)
GLUCOSE: 159 mg/dL — AB (ref 70–99)
POTASSIUM: 4.4 mmol/L (ref 3.5–5.1)
Sodium: 141 mmol/L (ref 135–145)

## 2018-08-20 LAB — CBC
HCT: 25.4 % — ABNORMAL LOW (ref 36.0–46.0)
HEMOGLOBIN: 8.3 g/dL — AB (ref 12.0–15.0)
MCH: 31.8 pg (ref 26.0–34.0)
MCHC: 32.7 g/dL (ref 30.0–36.0)
MCV: 97.3 fL (ref 80.0–100.0)
Platelets: 144 10*3/uL — ABNORMAL LOW (ref 150–400)
RBC: 2.61 MIL/uL — AB (ref 3.87–5.11)
RDW: 18 % — ABNORMAL HIGH (ref 11.5–15.5)
WBC: 6.7 10*3/uL (ref 4.0–10.5)
nRBC: 0 % (ref 0.0–0.2)

## 2018-08-20 NOTE — Progress Notes (Signed)
Physical Therapy Treatment Patient Details Name: Kelsey Novak MRN: [id number] DOB: 1930-07-09 Today's Date: 08/20/2018    History of Present Illness Pt s/p R TKR and with hx of L TKR and R RCR    PT Comments    Pt cooperative with increased activity tolerance noted with improved pain control.   Follow Up Recommendations  Home health PT     Equipment Recommendations  Rolling walker with 5" wheels    Recommendations for Other Services       Precautions / Restrictions Precautions Precautions: Knee;Fall Required Braces or Orthoses: Knee Immobilizer - Right Knee Immobilizer - Right: Discontinue once straight leg raise with < 10 degree lag(Pt performed IND SLR this pm) Restrictions Weight Bearing Restrictions: No Other Position/Activity Restrictions: WBAT    Mobility  Bed Mobility               General bed mobility comments: Pt up in chair and requests back to same  Transfers Overall transfer level: Needs assistance Equipment used: Rolling walker (2 wheeled) Transfers: Sit to/from Stand Sit to Stand: Min assist         General transfer comment: cues for LE management and use of UEs to self assist  Ambulation/Gait Ambulation/Gait assistance: Min assist;Min guard Gait Distance (Feet): 53 Feet Assistive device: Rolling walker (2 wheeled) Gait Pattern/deviations: Step-to pattern;Decreased step length - right;Decreased step length - left;Shuffle;Decreased stance time - right Gait velocity: decr   General Gait Details: cues for sequence, posture and position from RW.  Distance ltd by fatigue and increasing pain   Stairs             Wheelchair Mobility    Modified Rankin (Stroke Patients Only)       Balance Overall balance assessment: Mild deficits observed, not formally tested                                          Cognition Arousal/Alertness: Awake/alert Behavior During Therapy: WFL for tasks assessed/performed Overall  Cognitive Status: Within Functional Limits for tasks assessed                                        Exercises Total Joint Exercises Ankle Circles/Pumps: AROM;Both;20 reps;Supine Quad Sets: AROM;Both;10 reps;Supine Heel Slides: AAROM;Right;15 reps;Supine Straight Leg Raises: AAROM;AROM;Right;15 reps;Supine Goniometric ROM: AAROM R knee -10 - 50    General Comments        Pertinent Vitals/Pain Pain Assessment: 0-10 Pain Score: 5  Pain Location: R knee with WB - min pain at rest Pain Descriptors / Indicators: Aching;Sore Pain Intervention(s): Premedicated before session;Monitored during session;Limited activity within patient's tolerance(pt declines ice packs)    Home Living                      Prior Function            PT Goals (current goals can now be found in the care plan section) Acute Rehab PT Goals Patient Stated Goal: Regain IND PT Goal Formulation: With patient Time For Goal Achievement: 08/27/18 Potential to Achieve Goals: Good Progress towards PT goals: Progressing toward goals    Frequency    7X/week      PT Plan Current plan remains appropriate    Co-evaluation  AM-PAC PT "6 Clicks" Daily Activity  Outcome Measure  Difficulty turning over in bed (including adjusting bedclothes, sheets and blankets)?: Unable Difficulty moving from lying on back to sitting on the side of the bed? : Unable Difficulty sitting down on and standing up from a chair with arms (e.g., wheelchair, bedside commode, etc,.)?: Unable Help needed moving to and from a bed to chair (including a wheelchair)?: A Little Help needed walking in hospital room?: A Little Help needed climbing 3-5 steps with a railing? : A Little 6 Click Score: 12    End of Session Equipment Utilized During Treatment: Gait belt Activity Tolerance: Patient tolerated treatment well;Patient limited by fatigue Patient left: in chair;with call bell/phone within  reach;with family/visitor present Nurse Communication: Mobility status PT Visit Diagnosis: Difficulty in walking, not elsewhere classified (R26.2)     Time: 4742-5956 PT Time Calculation (min) (ACUTE ONLY): 26 min  Charges:  $Gait Training: 8-22 mins $Therapeutic Exercise: 8-22 mins                     Mauro Kaufmann PT Acute Rehabilitation Services Pager [phone] Office [phone]    Kelsey Novak 08/20/2018, 5:10 PM

## 2018-08-20 NOTE — Care Management Note (Signed)
Case Management Note  Patient Details  Name: Kelsey Novak MRN: [id number] Date of Birth: [birth year]  Subjective/Objective:   Discharge planning, spoke with patient and daughter at bedside. Have chosen [facility] for [facility] PT, evaluate and treat.   Action/Plan: Contacted [facility] for referral. Needs RW, contacted [facility] to deliver to room. [phone]                 Expected Discharge Date:                  Expected Discharge Plan:  Home w Home Health Services  In-House Referral:  NA  Discharge planning Services  CM Consult  Post Acute Care Choice:  Durable Medical Equipment, Home Health Choice offered to:  Patient  DME Arranged:  Walker rolling DME Agency:  [facility]  HH Arranged:  PT HH Agency:  [facility] ([facility])  Status of Service:  Completed, signed off  If discussed at [employer] of Stay Meetings, dates discussed:    Additional Comments:  Alexis Goodell, RN 08/20/2018, 9:32 AM

## 2018-08-20 NOTE — Progress Notes (Signed)
Physical Therapy Treatment Patient Details Name: Kelsey Novak MRN: [id number] DOB: [birth year] Today's Date: 08/20/2018    History of Present Illness Pt s/p R TKR and with hx of L TKR and R RCR    PT Comments    Pt s/p R TKR and presents with decreased R LE strength/ROM and post op pain limiting functional mobility.  Pt should progress to dc home with family assist.   Follow Up Recommendations  Home health PT     Equipment Recommendations  Rolling walker with 5" wheels    Recommendations for Other Services       Precautions / Restrictions Precautions Precautions: Knee;Fall Required Braces or Orthoses: Knee Immobilizer - Right Knee Immobilizer - Right: Discontinue once straight leg raise with < 10 degree lag(Pt performed IND SLR this am) Restrictions Weight Bearing Restrictions: No Other Position/Activity Restrictions: WBAT    Mobility  Bed Mobility Overal bed mobility: Needs Assistance Bed Mobility: Supine to Sit     Supine to sit: Min assist     General bed mobility comments: cues for sequence and use of L LE to self assist  Transfers Overall transfer level: Needs assistance Equipment used: Rolling walker (2 wheeled) Transfers: Sit to/from Stand Sit to Stand: Min assist         General transfer comment: cues for LE management and use of UEs to self assist  Ambulation/Gait Ambulation/Gait assistance: Min assist Gait Distance (Feet): 27 Feet Assistive device: Rolling walker (2 wheeled) Gait Pattern/deviations: Step-to pattern;Decreased step length - right;Decreased step length - left;Shuffle;Decreased stance time - right Gait velocity: decr   General Gait Details: cues for sequence, posture and position from RW.  Distance ltd by increasing pain with WB   Stairs             Wheelchair Mobility    Modified Rankin (Stroke Patients Only)       Balance Overall balance assessment: Mild deficits observed, not formally tested                                           Cognition Arousal/Alertness: Awake/alert Behavior During Therapy: WFL for tasks assessed/performed Overall Cognitive Status: Within Functional Limits for tasks assessed                                        Exercises      General Comments        Pertinent Vitals/Pain Pain Assessment: 0-10 Pain Score: 6  Pain Location: R knee with WB - min pain at rest Pain Descriptors / Indicators: Aching;Sore Pain Intervention(s): Limited activity within patient's tolerance;Monitored during session;Patient requesting pain meds-RN notified;Ice applied    Home Living Family/patient expects to be discharged to:: Private residence Living Arrangements: Alone Available Help at Discharge: Available 24 hours/day;Family Type of Home: House Home Access: Stairs to enter Entrance Stairs-Rails: None Home Layout: One level Home Equipment: Cane - single point;Bedside commode Additional Comments: Pts children will assist    Prior Function Level of Independence: Independent          PT Goals (current goals can now be found in the care plan section) Acute Rehab PT Goals Patient Stated Goal: Regain IND PT Goal Formulation: With patient Time For Goal Achievement: 08/27/18 Potential to Achieve Goals: Good    Frequency  7X/week      PT Plan      Co-evaluation              AM-PAC PT "6 Clicks" Daily Activity  Outcome Measure  Difficulty turning over in bed (including adjusting bedclothes, sheets and blankets)?: Unable Difficulty moving from lying on back to sitting on the side of the bed? : Unable Difficulty sitting down on and standing up from a chair with arms (e.g., wheelchair, bedside commode, etc,.)?: Unable Help needed moving to and from a bed to chair (including a wheelchair)?: A Little Help needed walking in hospital room?: A Little Help needed climbing 3-5 steps with a railing? : A Little 6 Click Score: 12     End of Session Equipment Utilized During Treatment: Gait belt Activity Tolerance: Patient tolerated treatment well;Patient limited by pain Patient left: in chair;with call bell/phone within reach;with family/visitor present Nurse Communication: Mobility status PT Visit Diagnosis: Difficulty in walking, not elsewhere classified (R26.2)     Time: 1610-9604 PT Time Calculation (min) (ACUTE ONLY): 23 min  Charges:  $Gait Training: 8-22 mins                     Mauro Kaufmann PT Acute Rehabilitation Services Pager [phone] Office [phone]    Jaking Thayer 08/20/2018, 12:41 PM

## 2018-08-20 NOTE — Plan of Care (Signed)

## 2018-08-20 NOTE — Progress Notes (Addendum)
Patient ID: Kelsey Novak, female   DOB: [birth year], 84 y.o.   MRN: [id number]     Subjective:  Patient reports pain as mild to moderate.  Patient in bed and in no acute distress denies any CP or SOB  Objective:   VITALS:   Vitals:   08/20/18 0923 08/20/18 0930 08/20/18 1004 08/20/18 1248  BP: (!) 112/49 (!) 108/47 (!) 110/55 123/60  Pulse:  62 60 (!) 57  Resp:   16   Temp:   97.7 F (36.5 C)   TempSrc:   Oral   SpO2:  100% 100% 100%  Weight:      Height:        ABD soft Sensation intact distally Dorsiflexion/Plantar flexion intact Incision: dressing C/D/I and moderate drainage Dressing over wrapped and in good shape  Plan for Dressing change tomorrow  Lab Results  Component Value Date   WBC 6.7 08/20/2018   HGB 8.3 (L) 08/20/2018   HCT 25.4 (L) 08/20/2018   MCV 97.3 08/20/2018   PLT 144 (L) 08/20/2018   BMET    Component Value Date/Time   NA 141 08/20/2018 0458   K 4.4 08/20/2018 0458   CL 108 08/20/2018 0458   CO2 25 08/20/2018 0458   GLUCOSE 159 (H) 08/20/2018 0458   BUN 9 08/20/2018 0458   CREATININE 0.77 08/20/2018 0458   CALCIUM 8.7 (L) 08/20/2018 0458   GFRNONAA >60 08/20/2018 0458   GFRAA >60 08/20/2018 0458     Assessment/Plan: 1 Day Post-Op   Principal Problem:   Primary localized osteoarthritis of right knee Active Problems:   S/P knee replacement   Advance diet Up with therapy Plan for discharge tomorrow Plan for dressing change tomorrow WBAT Dry dressing PRN  Anticipated LOS equal to or greater than 2 midnights due to - Age 36 and older with one or more of the following:  - Obesity  - Expected need for hospital services (PT, OT, Nursing) required for safe  discharge  - Anticipated need for postoperative skilled nursing care or inpatient rehab  - Active co-morbidities: None OR   - Unanticipated findings during/Post Surgery: Lab abnormalities and Slow post-op progression: GI, pain control, mobility  - Patient is a high risk  of re-admission due to: None     DOUGLAS PARRY, BRANDON 08/20/2018, 1:32 PM  Reviewed and agree with above.   Teryl Lucy, MD Cell [phone]

## 2018-08-21 ENCOUNTER — Encounter (HOSPITAL_COMMUNITY): Payer: Self-pay | Admitting: Orthopedic Surgery

## 2018-08-21 LAB — CBC
HEMATOCRIT: 23.1 % — AB (ref 36.0–46.0)
Hemoglobin: 7.5 g/dL — ABNORMAL LOW (ref 12.0–15.0)
MCH: 31.5 pg (ref 26.0–34.0)
MCHC: 32.5 g/dL (ref 30.0–36.0)
MCV: 97.1 fL (ref 80.0–100.0)
Platelets: 132 10*3/uL — ABNORMAL LOW (ref 150–400)
RBC: 2.38 MIL/uL — ABNORMAL LOW (ref 3.87–5.11)
RDW: 18.5 % — ABNORMAL HIGH (ref 11.5–15.5)
WBC: 10.5 10*3/uL (ref 4.0–10.5)
nRBC: 0 % (ref 0.0–0.2)

## 2018-08-21 NOTE — Progress Notes (Signed)
Physical Therapy Treatment Patient Details Name: Kelsey Novak MRN: [id number] DOB: [birth year] Today's Date: 08/21/2018    History of Present Illness Pt s/p R TKR and with hx of L TKR and R RCR    PT Comments    Pt in good spirits and progressing well with mobility.  Family present and reviewed home therex and stairs with written instruction provided.  Pt with dizziness on initial standing this am - BP 137/60.  No dizziness on second attempt to stand or with ambulation.  RN aware.   Follow Up Recommendations  Home health PT     Equipment Recommendations  Rolling walker with 5" wheels    Recommendations for Other Services       Precautions / Restrictions Precautions Precautions: Knee;Fall Required Braces or Orthoses: Knee Immobilizer - Right Knee Immobilizer - Right: Discontinue once straight leg raise with < 10 degree lag Restrictions Weight Bearing Restrictions: No Other Position/Activity Restrictions: WBAT    Mobility  Bed Mobility Overal bed mobility: Needs Assistance Bed Mobility: Supine to Sit     Supine to sit: Min guard     General bed mobility comments: min cues  Transfers Overall transfer level: Needs assistance Equipment used: Rolling walker (2 wheeled) Transfers: Sit to/from Stand Sit to Stand: Min guard         General transfer comment: cues for LE management and use of UEs to self assist  Ambulation/Gait Ambulation/Gait assistance: Min guard Gait Distance (Feet): 111 Feet Assistive device: Rolling walker (2 wheeled) Gait Pattern/deviations: Step-to pattern;Decreased step length - right;Decreased step length - left;Shuffle;Decreased stance time - right Gait velocity: decr   General Gait Details: min cues for sequence, posture and position from RW.    Stairs Stairs: Yes Stairs assistance: Min assist Stair Management: No rails;Step to pattern;Backwards;With walker Number of Stairs: 2 General stair comments: cues for sequence and  foot/RW placement.  Dtr present and written instruction provided   Wheelchair Mobility    Modified Rankin (Stroke Patients Only)       Balance Overall balance assessment: Mild deficits observed, not formally tested                                          Cognition Arousal/Alertness: Awake/alert Behavior During Therapy: WFL for tasks assessed/performed Overall Cognitive Status: Within Functional Limits for tasks assessed                                        Exercises Total Joint Exercises Ankle Circles/Pumps: AROM;Both;20 reps;Supine Quad Sets: AROM;Both;Supine;15 reps Heel Slides: AAROM;Right;Supine;20 reps Straight Leg Raises: AAROM;AROM;Right;Supine;20 reps Goniometric ROM: AAROM R knee -8 - 50    General Comments        Pertinent Vitals/Pain Pain Assessment: 0-10 Pain Score: 4  Pain Location: R knee with WB - min pain at rest Pain Descriptors / Indicators: Aching;Sore Pain Intervention(s): Limited activity within patient's tolerance;Monitored during session;Premedicated before session;Ice applied    Home Living                      Prior Function            PT Goals (current goals can now be found in the care plan section) Acute Rehab PT Goals Patient Stated Goal: Regain IND PT Goal Formulation: With  patient Time For Goal Achievement: 08/27/18 Potential to Achieve Goals: Good Progress towards PT goals: Progressing toward goals    Frequency    7X/week      PT Plan Current plan remains appropriate    Co-evaluation              AM-PAC PT "6 Clicks" Daily Activity  Outcome Measure  Difficulty turning over in bed (including adjusting bedclothes, sheets and blankets)?: A Lot Difficulty moving from lying on back to sitting on the side of the bed? : A Lot Difficulty sitting down on and standing up from a chair with arms (e.g., wheelchair, bedside commode, etc,.)?: A Lot Help needed moving to and  from a bed to chair (including a wheelchair)?: A Little Help needed walking in hospital room?: A Little Help needed climbing 3-5 steps with a railing? : A Little 6 Click Score: 15    End of Session Equipment Utilized During Treatment: Gait belt Activity Tolerance: Patient tolerated treatment well Patient left: in chair;with call bell/phone within reach;with family/visitor present Nurse Communication: Mobility status PT Visit Diagnosis: Difficulty in walking, not elsewhere classified (R26.2)     Time: 1610-9604 PT Time Calculation (min) (ACUTE ONLY): 35 min  Charges:  $Gait Training: 8-22 mins $Therapeutic Exercise: 8-22 mins                     Mauro Kaufmann PT Acute Rehabilitation Services Pager [phone] Office [phone]    Kelsey Novak 08/21/2018, 1:45 PM

## 2018-08-21 NOTE — Discharge Summary (Signed)
Physician Discharge Summary  Patient ID: Kelsey Novak MRN: [id number] DOB/AGE: Jun 28, 1930 84 y.o.  Admit date: 08/19/2018 Discharge date: 08/21/2018  Admission Diagnoses:  Primary localized osteoarthritis of right knee  Discharge Diagnoses:  Principal Problem:   Primary localized osteoarthritis of right knee Active Problems:   S/P knee replacement ABLA: observed  Past Medical History:  Diagnosis Date  . Arthritis   . Constipation   . GERD (gastroesophageal reflux disease)   . Headache(784.0)   . History of blood transfusion   . Hypertension   . Osteoarthritis of left knee 08/10/2014  . Primary localized osteoarthritis of right knee 08/19/2018  . Shortness of breath    With exertion    Surgeries: Procedure(s): RIGHT TOTAL KNEE ARTHROPLASTY on 08/19/2018   Consultants (if any):   Discharged Condition: Improved  Hospital Course: Kelsey Novak is an 84 y.o. female who was admitted 08/19/2018 with a diagnosis of Primary localized osteoarthritis of right knee and went to the operating room on 08/19/2018 and underwent the above named procedures.    She was given perioperative antibiotics:  Anti-infectives (From admission, onward)   Start     Dose/Rate Route Frequency Ordered Stop   08/19/18 2230  ceFAZolin (ANCEF) IVPB 2g/100 mL premix     2 g 200 mL/hr over 30 Minutes Intravenous Every 8 hours 08/19/18 1830 08/20/18 0629   08/19/18 1100  ceFAZolin (ANCEF) IVPB 2g/100 mL premix     2 g 200 mL/hr over 30 Minutes Intravenous On call to O.R. 08/19/18 1050 08/19/18 1433    .  She was given sequential compression devices, early ambulation, and aspirin for DVT prophylaxis.  She benefited maximally from the hospital stay and there were no complications.    Recent vital signs:  Vitals:   08/20/18 2006 08/21/18 0536  BP: (!) 120/51 114/62  Pulse: 62 61  Resp:  16  Temp: 98.4 F (36.9 C) 98.1 F (36.7 C)  SpO2: 99% 99%    Recent laboratory studies:  Lab Results   Component Value Date   HGB 7.5 (L) 08/21/2018   HGB 8.3 (L) 08/20/2018   HGB 11.0 (L) 08/13/2018   Lab Results  Component Value Date   WBC 10.5 08/21/2018   PLT 132 (L) 08/21/2018   Lab Results  Component Value Date   INR 1.01 08/02/2014   Lab Results  Component Value Date   NA 141 08/20/2018   K 4.4 08/20/2018   CL 108 08/20/2018   CO2 25 08/20/2018   BUN 9 08/20/2018   CREATININE 0.77 08/20/2018   GLUCOSE 159 (H) 08/20/2018    Discharge Medications:   Allergies as of 08/21/2018   No Known Allergies     Medication List    TAKE these medications   amLODipine 10 MG tablet Commonly known as:  NORVASC Take 10 mg by mouth daily.   aspirin EC 325 MG tablet Take 1 tablet (325 mg total) by mouth 2 (two) times daily. What changed:    medication strength  how much to take  when to take this   B-12 5000 MCG Caps Take 5,000 mcg by mouth daily.   HYDROcodone-acetaminophen 5-325 MG tablet Commonly known as:  NORCO/VICODIN Take 1-2 tablets by mouth every 6 (six) hours as needed for moderate pain. MAXIMUM TOTAL ACETAMINOPHEN DOSE IS 4000 MG PER DAY   lisinopril 20 MG tablet Commonly known as:  PRINIVIL,ZESTRIL Take 20 mg by mouth daily.   multivitamin with minerals Tabs tablet Take 2 tablets by mouth daily.  ondansetron 4 MG tablet Commonly known as:  ZOFRAN Take 1 tablet (4 mg total) by mouth every 8 (eight) hours as needed for nausea or vomiting.   REFRESH OPTIVE OP Place 1 drop into both eyes daily as needed (for dry eyes).   sennosides-docusate sodium 8.6-50 MG tablet Commonly known as:  SENOKOT-S Take 2 tablets by mouth daily.   traMADol 50 MG tablet Commonly known as:  ULTRAM Take 1 tablet (50 mg total) by mouth every 6 (six) hours as needed for moderate pain.       Diagnostic Studies: No results found.  Disposition:     Follow-up Information    Teryl Lucy, MD. Schedule an appointment as soon as possible for a visit in 2 weeks.    Specialty:  Orthopedic Surgery Contact information: [address] [place] [address] [phone]        [facility] Follow up.   Specialty:  Home Health Services Why:  physical therapy Contact information: [address] [place] [address] [place] [address] [phone]            Signed: Eulas Post 08/21/2018, 9:11 AM

## 2018-08-21 NOTE — Plan of Care (Signed)
  Problem: Skin Integrity: Goal: Will show signs of wound healing Outcome: Progressing   

## 2018-08-21 NOTE — Progress Notes (Addendum)
Patient ID: Kelsey Novak, female   DOB: 1930-07-25, 84 y.o.   MRN: [id number]     Subjective:  Patient reports pain as mild.  Patient in bed and in no acute distress denies any CP or SOB.  Patient states that she is feeling better  Objective:   VITALS:   Vitals:   08/20/18 1415 08/20/18 1813 08/20/18 2006 08/21/18 0536  BP: (!) 118/54 (!) 124/59 (!) 120/51 114/62  Pulse: 60 65 62 61  Resp: 14 16  16   Temp: 97.8 F (36.6 C) 98.6 F (37 C) 98.4 F (36.9 C) 98.1 F (36.7 C)  TempSrc: Oral Oral Oral Oral  SpO2: 100% 100% 99% 99%  Weight:      Height:        ABD soft Sensation intact distally Dorsiflexion/Plantar flexion intact Incision: dressing C/D/I and no drainage Dressing changed and wound in good condition   Lab Results  Component Value Date   WBC 10.5 08/21/2018   HGB 7.5 (L) 08/21/2018   HCT 23.1 (L) 08/21/2018   MCV 97.1 08/21/2018   PLT 132 (L) 08/21/2018   BMET    Component Value Date/Time   NA 141 08/20/2018 0458   K 4.4 08/20/2018 0458   CL 108 08/20/2018 0458   CO2 25 08/20/2018 0458   GLUCOSE 159 (H) 08/20/2018 0458   BUN 9 08/20/2018 0458   CREATININE 0.77 08/20/2018 0458   CALCIUM 8.7 (L) 08/20/2018 0458   GFRNONAA >60 08/20/2018 0458   GFRAA >60 08/20/2018 0458     Assessment/Plan: 2 Days Post-Op   Principal Problem:   Primary localized osteoarthritis of right knee Active Problems:   S/P knee replacement   Advance diet Up with therapy  ABLA with chronic anemia continue to monitor during the AM patient is not showing symptoms  Plan for DC later today if doing well with PT  WBAT dry dressing PRN    DOUGLAS PARRY, BRANDON 08/21/2018, 6:59 AM  Reviewed and agree with above.  Base transfusion on symptoms, no significiant cardiac risk factors.  Teryl Lucy, MD Cell [phone]

## 2019-01-02 ENCOUNTER — Ambulatory Visit
Admission: RE | Admit: 2019-01-02 | Discharge: 2019-01-02 | Disposition: A | Discharge status: Home / Self Care | Payer: Medicare HMO | Source: Ambulatory Visit | Attending: Orthopedic Surgery | Admitting: Orthopedic Surgery

## 2019-01-02 ENCOUNTER — Other Ambulatory Visit: Payer: Self-pay | Admitting: Orthopedic Surgery

## 2019-01-02 DIAGNOSIS — M25561 Pain in right knee: Secondary | ICD-10-CM

## 2019-12-23 IMAGING — CT CT KNEE*R* W/O CM
1 series · 12 of 14 positions shown, 15 images · non-contrast
Comparison: None.

CLINICAL DATA: History of right knee replacement in 6834. The
patient suffered a fall onto a cement walkway 01/01/2019 with onset
of diffuse right knee pain. Initial encounter.

EXAM:
CT OF THE RIGHT KNEE WITHOUT CONTRAST
TECHNIQUE: Multidetector CT imaging of the right knee was performed according
to the standard protocol. Multiplanar CT image reconstructions were
also generated.

[Series 4: knee soft tissue (person_name) · axial · 0.35mm/px · z∈[-314,-116]mm · 12 of 79 slices shown, 15 images]
[im 7/79  soft-tissue]
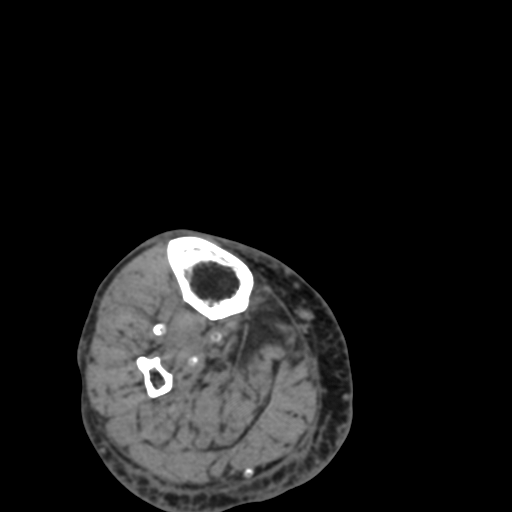
[im 7/79  bone]
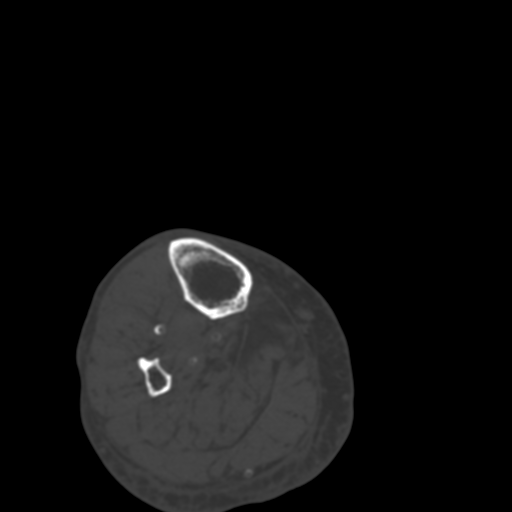
[im 13/79  bone]
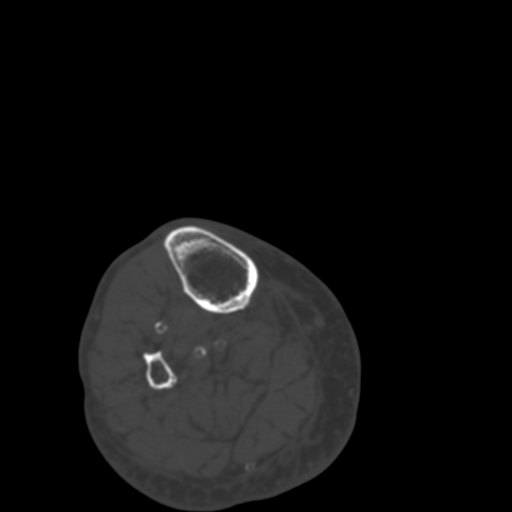
[im 19/79  bone]
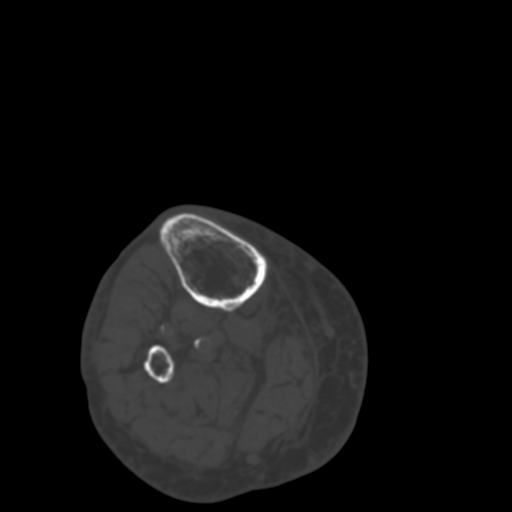
[im 25/79  bone]
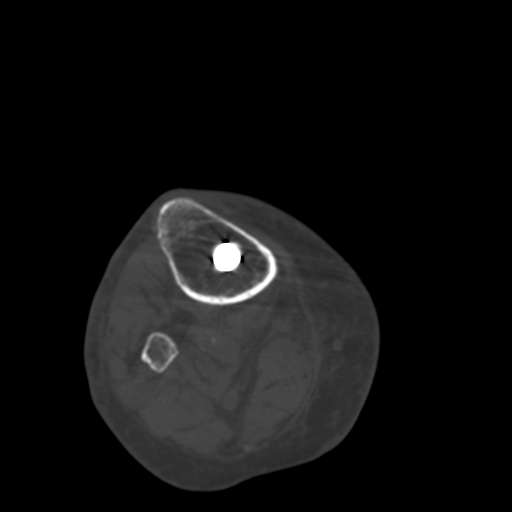
[im 31/79  soft-tissue]
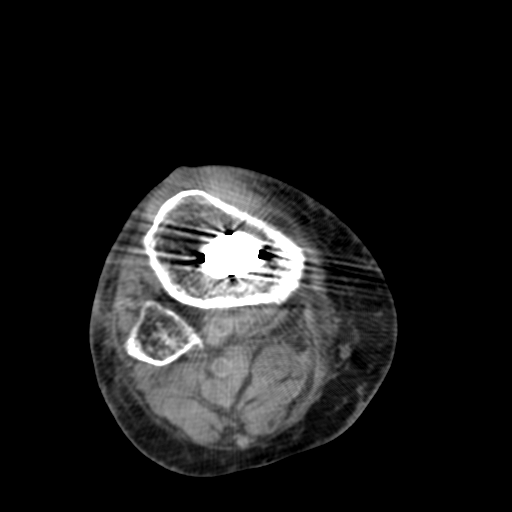
[im 31/79  bone]
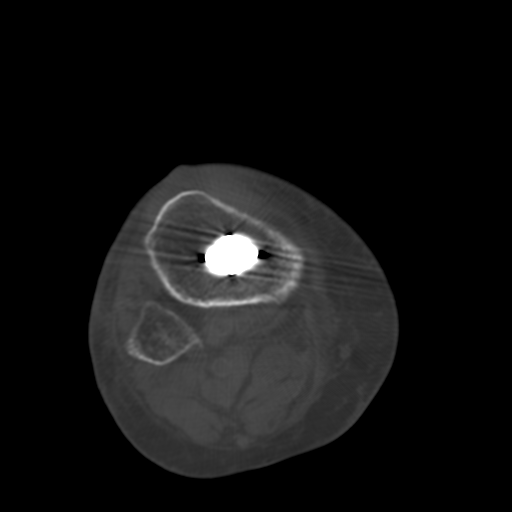
[im 37/79  bone]
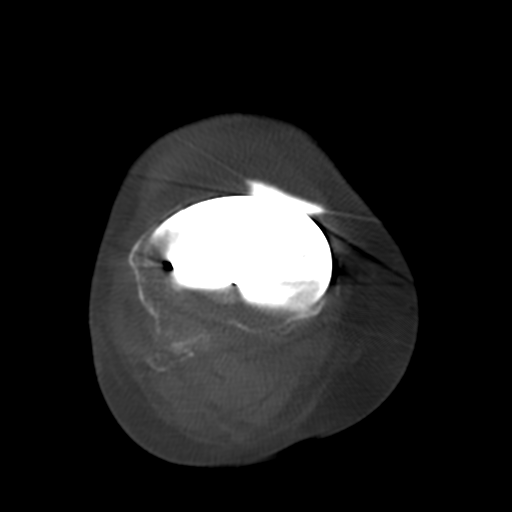
[im 43/79  bone]
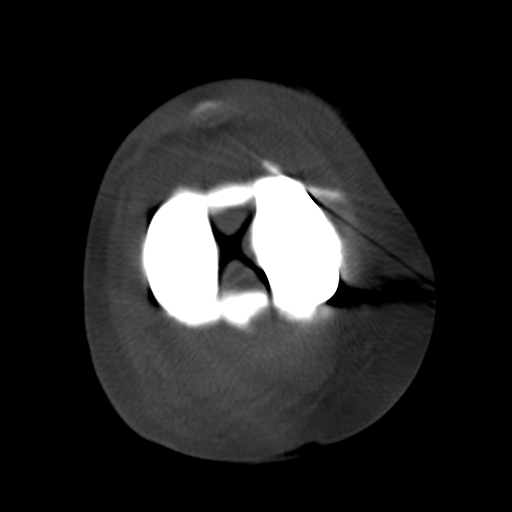
[im 49/79  bone]
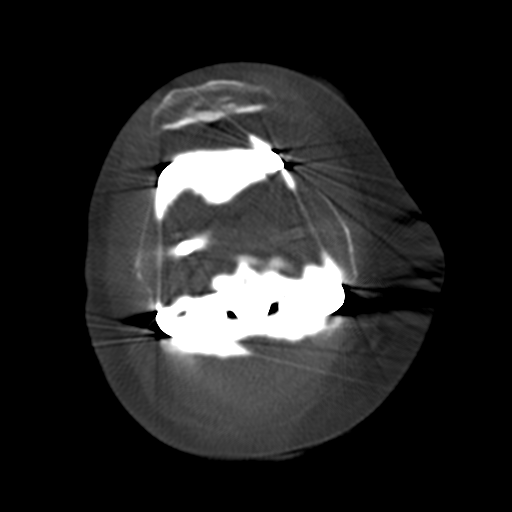
[im 55/79  soft-tissue]
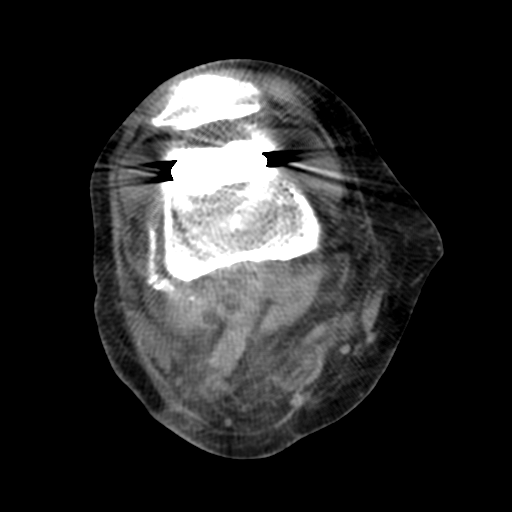
[im 55/79  bone]
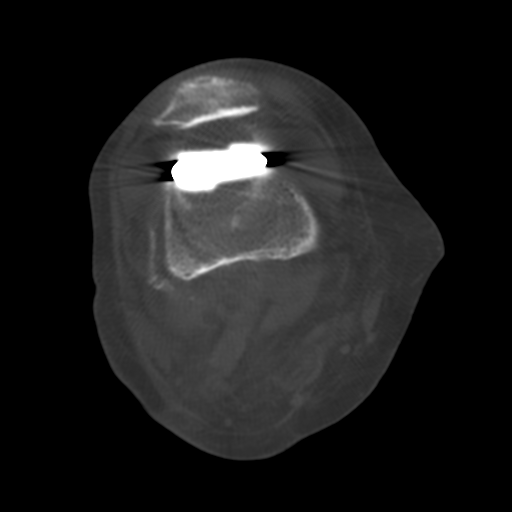
[im 61/79  bone]
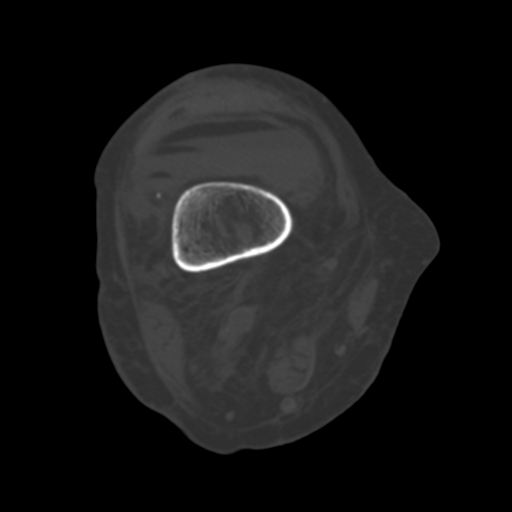
[im 67/79  bone]
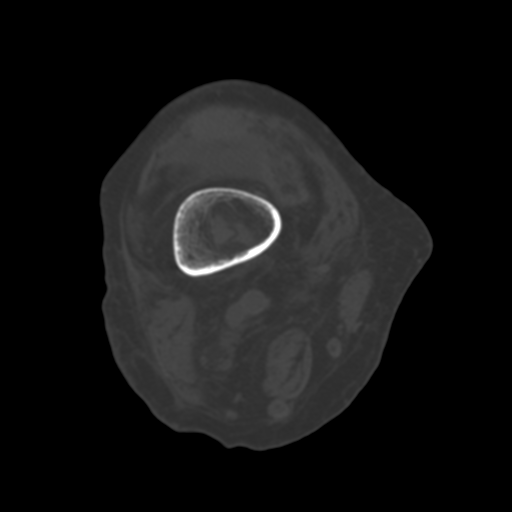
[im 73/79  bone]
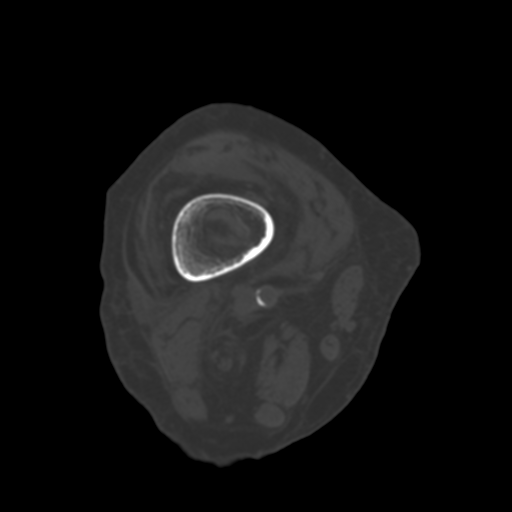

[12 of 14 positions shown; findings below may reference images not displayed]

FINDINGS: Bones/Joint/Cartilage

Right total knee arthroplasty causes artifact on the scan. There is
a fracture best seen along the lateral femoral metaphysis with
fragment override of approximately 1 cm. No other fracture is
identified. Bones are osteopenic.

Ligaments

Suboptimally assessed by CT.

Muscles and Tendons

Appear intact.

Soft tissues

Small to moderate lipohemarthrosis is noted. There is some edema in
the subcutaneous tissues about the knee.
IMPRESSION: Although streak artifact from the patient's knee arthroplasty
somewhat limits evaluation, there is an acute fracture of the distal
femur best seen along the lateral cortex of the metaphysis with
impaction of approximately 1 cm. No other fracture is identified.
Lipohemarthrosis noted.
